# Patient Record
Sex: Female | Born: 1940 | Race: Black or African American | Hispanic: No | Smoking: Never smoker
Health system: Southern US, Community
[De-identification: ages and names within clinical notes are randomized; demographics above are authoritative.]

## PROBLEM LIST (undated history)

## (undated) DIAGNOSIS — N189 Chronic kidney disease, unspecified: Secondary | ICD-10-CM

## (undated) DIAGNOSIS — E079 Disorder of thyroid, unspecified: Secondary | ICD-10-CM

## (undated) DIAGNOSIS — K219 Gastro-esophageal reflux disease without esophagitis: Secondary | ICD-10-CM

## (undated) DIAGNOSIS — M1990 Unspecified osteoarthritis, unspecified site: Secondary | ICD-10-CM

## (undated) DIAGNOSIS — K5790 Diverticulosis of intestine, part unspecified, without perforation or abscess without bleeding: Secondary | ICD-10-CM

## (undated) DIAGNOSIS — G5600 Carpal tunnel syndrome, unspecified upper limb: Secondary | ICD-10-CM

## (undated) DIAGNOSIS — I10 Essential (primary) hypertension: Secondary | ICD-10-CM

## (undated) DIAGNOSIS — K589 Irritable bowel syndrome without diarrhea: Secondary | ICD-10-CM

## (undated) DIAGNOSIS — I219 Acute myocardial infarction, unspecified: Secondary | ICD-10-CM

## (undated) DIAGNOSIS — E785 Hyperlipidemia, unspecified: Secondary | ICD-10-CM

## (undated) DIAGNOSIS — C50919 Malignant neoplasm of unspecified site of unspecified female breast: Secondary | ICD-10-CM

## (undated) DIAGNOSIS — K449 Diaphragmatic hernia without obstruction or gangrene: Secondary | ICD-10-CM

## (undated) DIAGNOSIS — D649 Anemia, unspecified: Secondary | ICD-10-CM

## (undated) DIAGNOSIS — I7300 Raynaud's syndrome without gangrene: Secondary | ICD-10-CM

## (undated) HISTORY — PX: CHOLECYSTECTOMY: SHX55

## (undated) HISTORY — PX: APPENDECTOMY: SHX54

---

## 2014-11-10 DIAGNOSIS — C50919 Malignant neoplasm of unspecified site of unspecified female breast: Secondary | ICD-10-CM

## 2014-11-10 HISTORY — DX: Malignant neoplasm of unspecified site of unspecified female breast: C50.919

## 2014-12-08 HISTORY — PX: BREAST LUMPECTOMY WITH AXILLARY LYMPH NODE BIOPSY: SHX5593

## 2014-12-11 ENCOUNTER — Encounter: Payer: Self-pay | Admitting: Radiation Oncology

## 2014-12-11 ENCOUNTER — Telehealth: Payer: Self-pay | Admitting: *Deleted

## 2014-12-11 NOTE — Progress Notes (Signed)
Location of Breast Cancer: Left Breast  9 o'clock position, Initial DX 11/10/14   Histology per Pathology Report: July 25,216 : Left Breast localization lumpectomy,lymph node bx and axillary lymph dissection ,Invasive mammary carcinoma ,Ductal  differentiated ,G3, Receptor Status: ER( neg  ), PR (  neg ), Her2-neu ( neg  )  Did patient present with symptoms (if so, please note symptoms) or was this found on screening mammography?: patient discovered lump   Past/Anticipated interventions by surgeon, if any:Dr. Horowitz,Joel,MD 12/08/14,[place] follow up scheduled with Dr. Tona Sensing for December 18, 2014  Past/Anticipated interventions by medical oncology, if any: Chemotherapy : scheduled for new consult with Gudena on 12/15/2014 Lymphedema issues, if any: NO  Pain issues, if any:  Reports mild left breast pain only following activity such as house work  SAFETY ISSUES:  Prior radiation? NO  Pacemaker/ICD? NO  Possible current pregnancy?N/A  Is the patient on methotrexate? NO  Current Complaints / other details:  Widowed, Retired,  [id number], menses age 74, lmp=65, estrogen 2 years,non smoker, no alcohol or illicit drug use, no smokeless tobaco  Father hx stroke/cva,heart disease,DM,   Allergies: NKDA  Full ROM of both upper extremities noted. No lymphedema noted. Denies nipple discharge. Resides in [place]. Living with daughter until treatments are complete. Hopes to return home to [place] eventually.  BP 142/67 mmHg  Pulse 67  Temp(Src) 98 F (36.7 C) (Oral)  Resp 16  Ht 5' 6"  (1.676 m)  Wt 196 lb 4.8 oz (89.041 kg)  BMI 31.70 kg/m2  SpO2 100% Wt Readings from Last 3 Encounters:  12/15/14 196 lb 4.8 oz (89.041 kg)

## 2014-12-11 NOTE — Telephone Encounter (Signed)
Received referral for pt to see Dr. Lindi Adie.  Called and spoke w/ pt's daughter Annette Meadows) and confirmed 12/15/14 appt w/ pt.  Unable to mail before appt letter - gave verbal.  Unable to mail welcoming packet - gave directions and instructions.  Unable to mail intake form - placed a note for one to be given at time of check in.  Called pt's PCP and left a message for Annette Meadows - Dr. Francine Meadows assistant requesting the referral auth.  Placed records in Dr. Geralyn Flash box.  Did not need to send a copy to HIM - they are already scanned in.

## 2014-12-12 ENCOUNTER — Telehealth: Payer: Self-pay | Admitting: *Deleted

## 2014-12-12 NOTE — Telephone Encounter (Signed)
Received a call from [place] at pt's PCP and she stated that pt's secondary insurance [employer] does not require a referral auth.

## 2014-12-15 ENCOUNTER — Ambulatory Visit: Payer: Medicare Other

## 2014-12-15 ENCOUNTER — Ambulatory Visit
Admission: RE | Admit: 2014-12-15 | Discharge: 2014-12-15 | Disposition: A | Discharge status: Home / Self Care | Payer: Medicare Other | Source: Ambulatory Visit | Attending: Radiation Oncology | Admitting: Radiation Oncology

## 2014-12-15 ENCOUNTER — Encounter: Payer: Self-pay | Admitting: Hematology and Oncology

## 2014-12-15 ENCOUNTER — Encounter: Payer: Self-pay | Admitting: Radiation Oncology

## 2014-12-15 ENCOUNTER — Telehealth: Payer: Self-pay | Admitting: Hematology and Oncology

## 2014-12-15 ENCOUNTER — Ambulatory Visit (HOSPITAL_BASED_OUTPATIENT_CLINIC_OR_DEPARTMENT_OTHER): Payer: Medicare Other | Admitting: Hematology and Oncology

## 2014-12-15 VITALS — BP 142/67 | HR 67 | Temp 98.0°F | Resp 16 | Ht 66.0 in | Wt 196.3 lb

## 2014-12-15 VITALS — BP 151/69 | HR 69 | Temp 98.4°F | Resp 18 | Ht 66.0 in | Wt 197.6 lb

## 2014-12-15 DIAGNOSIS — E119 Type 2 diabetes mellitus without complications: Secondary | ICD-10-CM | POA: Diagnosis not present

## 2014-12-15 DIAGNOSIS — Z510 Encounter for antineoplastic radiation therapy: Secondary | ICD-10-CM | POA: Diagnosis not present

## 2014-12-15 DIAGNOSIS — Z171 Estrogen receptor negative status [ER-]: Secondary | ICD-10-CM | POA: Diagnosis not present

## 2014-12-15 DIAGNOSIS — C50212 Malignant neoplasm of upper-inner quadrant of left female breast: Secondary | ICD-10-CM

## 2014-12-15 DIAGNOSIS — C50412 Malignant neoplasm of upper-outer quadrant of left female breast: Secondary | ICD-10-CM | POA: Insufficient documentation

## 2014-12-15 HISTORY — DX: Anemia, unspecified: D64.9

## 2014-12-15 HISTORY — DX: Raynaud's syndrome without gangrene: I73.00

## 2014-12-15 HISTORY — DX: Irritable bowel syndrome, unspecified: K58.9

## 2014-12-15 HISTORY — DX: Malignant neoplasm of unspecified site of unspecified female breast: C50.919

## 2014-12-15 HISTORY — DX: Hyperlipidemia, unspecified: E78.5

## 2014-12-15 HISTORY — DX: Chronic kidney disease, unspecified: N18.9

## 2014-12-15 HISTORY — DX: Acute myocardial infarction, unspecified: I21.9

## 2014-12-15 HISTORY — DX: Essential (primary) hypertension: I10

## 2014-12-15 HISTORY — DX: Carpal tunnel syndrome, unspecified upper limb: G56.00

## 2014-12-15 HISTORY — DX: Gastro-esophageal reflux disease without esophagitis: K21.9

## 2014-12-15 HISTORY — DX: Unspecified osteoarthritis, unspecified site: M19.90

## 2014-12-15 HISTORY — DX: Diaphragmatic hernia without obstruction or gangrene: K44.9

## 2014-12-15 HISTORY — DX: Diverticulosis of intestine, part unspecified, without perforation or abscess without bleeding: K57.90

## 2014-12-15 HISTORY — DX: Disorder of thyroid, unspecified: E07.9

## 2014-12-15 MED ORDER — ONDANSETRON HCL 8 MG PO TABS: 8.0000 mg | ORAL_TABLET | Freq: Two times a day (BID) | ORAL | Status: DC

## 2014-12-15 MED ORDER — PROCHLORPERAZINE MALEATE 10 MG PO TABS: 10.0000 mg | ORAL_TABLET | Freq: Four times a day (QID) | ORAL | Status: DC | PRN

## 2014-12-15 MED ORDER — LORAZEPAM 0.5 MG PO TABS: 0.5000 mg | ORAL_TABLET | Freq: Every day | ORAL | Status: DC

## 2014-12-15 NOTE — Assessment & Plan Note (Signed)
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  Recommendation: 1. Adjuvant chemotherapy with Taxotere and Cytoxan every 3 weeks 4 cycles 2. Followed by adjuvant radiation therapy  I decided on Taxotere and Cytoxan because of her advanced age as well as her borderline performance status. I recommended putting a port but patient is reluctant to having any surgeries. She wants to try with the peripheral veins. I discussed with her extensively become difficult to give chemotherapy if her veins do not cooperate. If her veins are not able to receive chemotherapy then we will have to make a recommendation for port implantation.  Because she is diabetic, I cannot use dexamethasone for premedication prior to chemotherapy. Return to clinic next Tuesday to start chemotherapy and one week later to have an appointment with the nurse practitioner for follow-up.

## 2014-12-15 NOTE — Progress Notes (Signed)
Radiation Oncology         [phone] ________________________________  Name: Annette Meadows MRN: [id number]  Date: 12/15/2014  DOB: [birth year]  CC:Pcp Not In System  Theodoro Doing, MD     REFERRING PHYSICIAN: Arne Cleveland I, MD  DIAGNOSIS: The primary encounter diagnosis was Malignant neoplasm of upper-outer quadrant of left female breast. A diagnosis of Breast cancer of upper-inner quadrant of left female breast was also pertinent to this visit. pT2,pN51m,pM  HISTORY OF PRESENT ILLNESS::Annette PTottyis a 74y.o. female who is seen for an initial consultation visit regarding the patient's diagnosis of breast cancer.  The patient was found to have suspicious findings within the left breast on initial mammogram. The patient has had symptoms prior to this study: PCP felt lump. A diagnostic mammogram and breast ultrasound confirmed this finding. On ultrasound, the tumor measured 3 cm with microcalcifications and was present in the lower inner quadrant of the left breast. Likely benign findings in right breast which will be monitored.   A biopsy was performed. This revealed invasive mammary carcincoma.   The patient has not undergone an MRI scan of  the breasts.  The patient proceeded to undergo surgical resection of the breast cancer. This corresponded to a lumpectomy. Pathology revealed a grade III invasive mammary carcinoma that measured 10.1 mm. The margins were negative. In terms of lymph nodes, 1/2 nodes were positive for metastatic disease. Receptor studies indicated that the tumor is estrogen receptor negative, progesterone receptor negative, and HER-2/neu negative.  An Oncotype test was not ordered.    Full ROM of both upper extremities noted. No lymphedema noted. Denies nipple discharge. [place] [facility] [place] are [place]. [place]. [place]     PREVIOUS RADIATION THERAPY: No   PAST MEDICAL HISTORY:  has a past medical history of Breast cancer (11/10/14); GERD (gastroesophageal reflux disease); Chronic kidney disease; Thyroid disease; Hypertension; IBS (irritable bowel syndrome); Myocardial infarction; Diverticulosis; Hiatal hernia; Hyperlipidemia; Osteoarthritis; CTS (carpal tunnel syndrome); Raynaud phenomenon; Anemia; and Diaphragmatic hernia.     PAST SURGICAL HISTORY: Past Surgical History  Procedure Laterality Date  . Cholecystectomy    . Appendectomy    . Breast lumpectomy with axillary lymph node biopsy Left 12/08/14     FAMILY HISTORY: family history is negative for Cancer.   SOCIAL HISTORY:  reports that she has never smoked. She has never used smokeless tobacco. She reports that she does not drink alcohol or use illicit drugs.   ALLERGIES: Review of patient's allergies indicates no known allergies.   MEDICATIONS:  Current Outpatient Prescriptions  Medication Sig Dispense Refill  . amLODipine-benazepril (LOTREL) 5-40 MG per capsule Take 1 capsule by mouth daily.    .Marland Kitchenatorvastatin (LIPITOR) 40 MG tablet Take 40 mg by mouth daily.    . carvedilol (COREG) 25 MG tablet Take 25 mg by mouth 2 (two) times daily with a meal.    . Cetirizine HCl (ZYRTEC ALLERGY PO) Take 10 mg by mouth daily.    . Cholecalciferol (VITAMIN D) 2000 UNITS tablet Take 2,000 Units by mouth daily.    . diclofenac sodium (VOLTAREN) 1 % GEL Apply topically 2 (two) times daily.    .Marland Kitchenesomeprazole (NEXIUM) 40 MG capsule Take 40 mg by mouth daily at 12 noon.    . fluticasone (FLONASE) 50 MCG/ACT nasal spray Place 1 spray into both nostrils 2 (  two) times daily.    Marland Kitchen gabapentin (NEURONTIN) 300 MG capsule Take 300 mg by mouth 3 (three) times daily.    . Insulin Glargine (LANTUS SOLOSTAR) 100 UNIT/ML Solostar Pen Inject 40 Units into the skin daily at 10 pm.    . levothyroxine (SYNTHROID, LEVOTHROID) 50 MCG tablet Take 50  mcg by mouth daily before breakfast.    . montelukast (SINGULAIR) 10 MG tablet Take 10 mg by mouth at bedtime.    . SitaGLIPtin-MetFORMIN HCl (JANUMET XR) 50-1000 MG TB24 Take 1,000 mg by mouth 2 (two) times daily.    . traMADol (ULTRAM) 50 MG tablet Take by mouth every 6 (six) hours as needed.    Marland Kitchen aspirin 81 MG tablet Take 81 mg by mouth daily.    . cholestyramine light (PREVALITE) 4 G packet Take 4 g by mouth daily.    . Diclofenac Sodium (PENNSAID) 2 % SOLN Place 1 spray onto the skin 2 (two) times daily.    Marland Kitchen LORazepam (ATIVAN) 0.5 MG tablet Take 1 tablet (0.5 mg total) by mouth at bedtime. 30 tablet 0  . ondansetron (ZOFRAN) 8 MG tablet Take 1 tablet (8 mg total) by mouth 2 (two) times daily. Start the day after chemo for 3 days. Then take as needed for nausea or vomiting. 30 tablet 1  . oxybutynin (DITROPAN-XL) 10 MG 24 hr tablet   0  . OXYBUTYNIN CHLORIDE ER PO Take 10 mg by mouth daily.    . prochlorperazine (COMPAZINE) 10 MG tablet Take 1 tablet (10 mg total) by mouth every 6 (six) hours as needed (Nausea or vomiting). 30 tablet 1   No current facility-administered medications for this encounter.     REVIEW OF SYSTEMS:  A 15 point review of systems is documented in the electronic medical record. This was obtained by the nursing staff. However, I reviewed this with the patient to discuss relevant findings and make appropriate changes.  Pertinent items are noted in HPI.    PHYSICAL EXAM:  height is 5' 6"  (1.676 m) and weight is 196 lb 4.8 oz (89.041 kg). Her oral temperature is 98 F (36.7 C). Her blood pressure is 142/67 and her pulse is 67. Her respiration is 16 and oxygen saturation is 100%.    ECOG = 0  0 - Asymptomatic (Fully active, able to carry on all predisease activities without restriction)  1 - Symptomatic but completely ambulatory (Restricted in physically strenuous activity but ambulatory and able to carry out work of a light or sedentary nature. For example, light  housework, office work)  2 - Symptomatic, <50% in bed during the day (Ambulatory and capable of all self care but unable to carry out any work activities. Up and about more than 50% of waking hours)  3 - Symptomatic, >50% in bed, but not bedbound (Capable of only limited self-care, confined to bed or chair 50% or more of waking hours)  4 - Bedbound (Completely disabled. Cannot carry on any self-care. Totally confined to bed or chair)  5 - Death   Eustace Pen MM, Creech RH, Tormey DC, et al. [phone]). "Toxicity and response criteria of the [facility]". [place] Oncol. 5 (6): 649-55  General: Well-developed, in no acute distress HEENT: Normocephalic, atraumatic; oral cavity clear Neck: Supple without any lymphadenopathy Cardiovascular: Regular rate and rhythm Respiratory: Clear to auscultation bilaterally Breasts: Well-healed incision horizontally medial to nipple. Well-healed axillary incision. No other suspicious findings in the breast.  GI: Soft, nontender, normal bowel  sounds Extremities: No edema present Neuro: No focal deficits   LABORATORY DATA:  No results found for: WBC, HGB, HCT, MCV, PLT No results found for: NA, K, CL, CO2 No results found for: ALT, AST, GGT, ALKPHOS, BILITOT    RADIOGRAPHY: No results found.   Findings for [facility] Report Rexene Edison), June 9th 2016       IMPRESSION:    Breast cancer of upper-inner quadrant of left female breast   11/10/2014 Initial Diagnosis left breast biopsy: Invasive ductal carcinoma, grade 3 ER negative, PR negative HER-2/neu equivocal 2+ by IHC, FISH negative ratio 1.3   11/26/2014 Surgery left lumpectomy invasive lobular cancer, grade 3, multifocal, 2.5 cm and 0.25 cm, margins negative, ER 0%, PR 0%, HER-2 -1/11 micro-met 0.3 cm    The patient has a recent diagnosis of invasive mammary carcinoma of the left breast. She has completed surgery and appears to be doing  satisfactorily postoperatively.  The patient is a good candidate for postoperative radiation treatment at the appropriate time. On pathology, the patient underwent a lumpectomy with negative margins for her triple negative breast cancer. One micrometastasis was seen in a lymph node. I discussed with the patient the role of adjuvant radiation treatment in this setting. We discussed the potential benefit of radiation treatment, especially with regards to local control of the patient's tumor. We also discussed the possible side effects and risks of such a treatment as well.   All of the patient's questions were answered.   PLAN: I will follow up with the patient after she meets with medical oncology. We will then decide the appropriate time frame to begin adjuvant radiation treatment which I believe would be helpful for her case.  Patient will see Dr.Gudena later today.      ________________________________   Jodelle Gross, MD, PhD   **Disclaimer: This note was dictated with voice recognition software. Similar sounding words can inadvertently be transcribed and this note may contain transcription errors which may not have been corrected upon publication of note.**  This document serves as a record of services personally performed by Kyung Rudd, MD. It was created on his behalf by Derek Mound, a trained medical scribe. The creation of this record is based on the scribe's personal observations and the provider's statements to them. This document has been checked and approved by the attending provider.

## 2014-12-15 NOTE — Telephone Encounter (Signed)
Gave avs & calendar for August through October

## 2014-12-15 NOTE — Progress Notes (Signed)
New pt intake for received from patient.  Chart updated.  Reviewed by Dr. Lindi Adie.  Sent to scan.

## 2014-12-15 NOTE — Progress Notes (Signed)
[facility] CONSULT NOTE  Patient Care Team: Pcp Not In System as PCP - General  CHIEF COMPLAINTS/PURPOSE OF CONSULTATION:  Newly diagnosed breast cancer  HISTORY OF PRESENTING ILLNESS:  Annette Meadows 74 y.o. female is here because of recent diagnosis of left breast cancer. Patient lives in [place]. She had always felt a nodule in the left breast which was not bothering her. Recently she fell from her bed on her breast and started to have pain in that area. She then went on to see her primary care physician who palpated the breast and felt the nodule. This led to mammogram ultrasound and biopsy which confirmed that she had triple negative left breast cancer. She underwent left lumpectomy which revealed invasive lobular cancer grade 3 multifocal with the largest measuring to 1/2 cm in size. Triple negative disease. However 1/11 lymph nodes with micrometastatic disease measuring 0.3 cm. She moved to [facility] because she wanted to live close to her daughter. She is here accompanied by her daughter to discuss adjuvant treatment options. She has recovered very well from recent surgery and appears to have very minimal discomfort.  I reviewed her records extensively and collaborated the history with the patient.  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 2+ by IHC, FISH negative ratio 1.3   11/26/2014 Surgery left lumpectomy invasive lobular cancer, grade 3, multifocal, 2.5 cm and 0.25 cm, margins negative, ER 0%, PR 0%, HER-2 -1/11 micro-met 0.3 cm   MEDICAL HISTORY:  Past Medical History  Diagnosis Date  . Breast cancer 11/10/14    initial bx left breast 9 o'clock mass  . GERD (gastroesophageal reflux disease)   . Chronic kidney disease     chronic kidney stage 3  . Thyroid disease      hypo  . Hypertension   . IBS (irritable bowel syndrome)   . Myocardial infarction   . Diverticulosis   . Hiatal hernia   . Hyperlipidemia   . Osteoarthritis   . CTS (carpal tunnel syndrome)     B/L  . Raynaud phenomenon   . Anemia     chronic  . Diaphragmatic hernia     SURGICAL HISTORY: Past Surgical History  Procedure Laterality Date  . Cholecystectomy    . Appendectomy    . Breast lumpectomy with axillary lymph node biopsy Left 12/08/14    SOCIAL HISTORY: History   Social History  . Marital Status: Widowed    Spouse Name: N/A  . Number of Children: N/A  . Years of Education: N/A   Occupational History  . Not on file.   Social History Main Topics  . Smoking status: Never Smoker   . Smokeless tobacco: Never Used  . Alcohol Use: No  . Drug Use: No  . Sexual Activity: No   Other Topics Concern  . Not on file   Social History Narrative    FAMILY HISTORY: Family History  Problem Relation Age of Onset  . Cancer Neg Hx     ALLERGIES:  has No Known Allergies.  MEDICATIONS:  Current Outpatient Prescriptions  Medication Sig Dispense Refill  . amLODipine-benazepril (LOTREL) 5-40 MG per capsule Take 1 capsule by mouth daily.    Marland Kitchen aspirin 81 MG tablet Take 81 mg by mouth daily.    Marland Kitchen atorvastatin (LIPITOR) 40 MG tablet Take 40 mg  by mouth daily.    . carvedilol (COREG) 25 MG tablet Take 25 mg by mouth 2 (two) times daily with a meal.    . Cetirizine HCl (ZYRTEC ALLERGY PO) Take 10 mg by mouth daily.    . Cholecalciferol (VITAMIN D) 2000 UNITS tablet Take 2,000 Units by mouth daily.    . cholestyramine light (PREVALITE) 4 G packet Take 4 g by mouth daily.    . Diclofenac Sodium (PENNSAID) 2 % SOLN Place 1 spray onto the skin 2 (two) times daily.    . diclofenac sodium (VOLTAREN) 1 % GEL Apply topically 2 (two) times daily.    Marland Kitchen esomeprazole (NEXIUM) 40 MG capsule Take 40 mg by mouth daily at 12 noon.    . fluticasone (FLONASE) 50 MCG/ACT nasal spray Place 1  spray into both nostrils 2 (two) times daily.    Marland Kitchen gabapentin (NEURONTIN) 300 MG capsule Take 300 mg by mouth 3 (three) times daily.    . Insulin Glargine (LANTUS SOLOSTAR) 100 UNIT/ML Solostar Pen Inject 40 Units into the skin daily at 10 pm.    . levothyroxine (SYNTHROID, LEVOTHROID) 50 MCG tablet Take 50 mcg by mouth daily before breakfast.    . montelukast (SINGULAIR) 10 MG tablet Take 10 mg by mouth at bedtime.    Marland Kitchen oxybutynin (DITROPAN-XL) 10 MG 24 hr tablet   0  . OXYBUTYNIN CHLORIDE ER PO Take 10 mg by mouth daily.    . SitaGLIPtin-MetFORMIN HCl (JANUMET XR) 50-1000 MG TB24 Take 1,000 mg by mouth 2 (two) times daily.    . traMADol (ULTRAM) 50 MG tablet Take by mouth every 6 (six) hours as needed.    Marland Kitchen LORazepam (ATIVAN) 0.5 MG tablet Take 1 tablet (0.5 mg total) by mouth at bedtime. 30 tablet 0  . ondansetron (ZOFRAN) 8 MG tablet Take 1 tablet (8 mg total) by mouth 2 (two) times daily. Start the day after chemo for 3 days. Then take as needed for nausea or vomiting. 30 tablet 1  . prochlorperazine (COMPAZINE) 10 MG tablet Take 1 tablet (10 mg total) by mouth every 6 (six) hours as needed (Nausea or vomiting). 30 tablet 1   No current facility-administered medications for this visit.    REVIEW OF SYSTEMS:   Constitutional: Denies fevers, chills or abnormal night sweats Eyes: Denies blurriness of vision, double vision or watery eyes Ears, nose, mouth, throat, and face: Denies mucositis or sore throat Respiratory: Denies cough, dyspnea or wheezes Cardiovascular: Denies palpitation, chest discomfort or lower extremity swelling Gastrointestinal:  Denies nausea, heartburn or change in bowel habits Skin: Denies abnormal skin rashes Lymphatics: Denies new lymphadenopathy or easy bruising Neurological:Denies numbness, tingling or new weaknesses Behavioral/Psych: Mood is stable, no new changes  Breast: recovering very well from recent surgery All other systems were reviewed with the patient  and are negative.  PHYSICAL EXAMINATION: ECOG PERFORMANCE STATUS: 1 - Symptomatic but completely ambulatory  Filed Vitals:   12/15/14 1510  BP: 151/69  Pulse: 69  Temp: 98.4 F (36.9 C)  Resp: 18   Filed Weights   12/15/14 1510  Weight: 197 lb 9.6 oz (89.631 kg)    GENERAL:alert, no distress and comfortable SKIN: skin color, texture, turgor are normal, no rashes or significant lesions EYES: normal, conjunctiva are pink and non-injected, sclera clear OROPHARYNX:no exudate, no erythema and lips, buccal mucosa, and tongue normal  NECK: supple, thyroid normal size, non-tender, without nodularity LYMPH:  no palpable lymphadenopathy in the cervical, axillary or inguinal LUNGS: clear to  auscultation and percussion with normal breathing effort HEART: regular rate & rhythm and no murmurs and no lower extremity edema ABDOMEN:abdomen soft, non-tender and normal bowel sounds Musculoskeletal:no cyanosis of digits and no clubbing  PSYCH: alert & oriented x 3 with fluent speech NEURO: no focal motor/sensory deficits BREAST:breasts and axillary scars are well-healed (exam performed in the presence of a chaperone)   RADIOGRAPHIC STUDIES: I have personally reviewed the radiological reports and agreed with the findings in the report.  ASSESSMENT AND PLAN:  Breast cancer of upper-inner quadrant of left female breast Left breast invasive lobular cancer 2.5 cm in size status post lumpectomy showing invasive lobular cancer, grade 3, multifocal, 2.5 cm and 0.25 cm, margins negative, ER 0%, PR 0%, HER-2 -1/11 micro-met (0.3 cm)  Pathology review: I discussed with the patient the details pathology report including the significance of triple negative disease and how she is not eligible to receive any antiestrogen therapies.  Recommendation: 1. Adjuvant chemotherapy with Taxotere and Cytoxan every 3 weeks 4 cycles 2. Followed by adjuvant radiation therapy  I decided on Taxotere and Cytoxan because  of her advanced age as well as her borderline performance status. I recommended putting a port but patient is reluctant to having any surgeries. She wants to try with the peripheral veins. I discussed with her extensively become difficult to give chemotherapy if her veins do not cooperate. If her veins are not able to receive chemotherapy then we will have to make a recommendation for port implantation.  Chemotherapy counseling:I have discussed the risks and benefits of chemotherapy including the risks of nausea/ vomiting, risk of infection from low WBC count, fatigue due to chemo or anemia, bruising or bleeding due to low platelets, mouth sores, loss/ change in taste and decreased appetite. Liver and kidney function will be monitored through out chemotherapy as abnormalities in liver and kidney function may be a side effect of treatment. Risk of permanent bone marrow dysfunction and leukemia due to chemo were also discussed.  Diabetes mellitus:Because she is diabetic, I cannot use dexamethasone for premedication prior to chemotherapy. Return to clinic next Tuesday to start chemotherapy and one week later to have an appointment with the nurse practitioner for follow-up. patient really enjoys watching college football and has season tickets and would not like to [place]. Much of the season. She understands that she may have to miss a few games.  All questions were answered. The patient knows to call the clinic with any problems, questions or concerns.    Rulon Eisenmenger, MD 5:18 PM

## 2014-12-15 NOTE — Progress Notes (Signed)
See progress note under physician encounter. 

## 2014-12-16 ENCOUNTER — Encounter: Payer: Self-pay | Admitting: *Deleted

## 2014-12-16 ENCOUNTER — Other Ambulatory Visit: Payer: Medicare Other

## 2014-12-16 ENCOUNTER — Other Ambulatory Visit: Payer: Self-pay

## 2014-12-16 ENCOUNTER — Encounter: Payer: Self-pay | Admitting: Radiation Oncology

## 2014-12-16 ENCOUNTER — Telehealth: Payer: Self-pay | Admitting: Hematology and Oncology

## 2014-12-16 NOTE — Telephone Encounter (Signed)
Added lab for [id number]...the patient will get new sched at 8.9 visit

## 2014-12-23 ENCOUNTER — Ambulatory Visit (HOSPITAL_BASED_OUTPATIENT_CLINIC_OR_DEPARTMENT_OTHER): Payer: Medicare Other | Admitting: Hematology and Oncology

## 2014-12-23 ENCOUNTER — Ambulatory Visit (HOSPITAL_BASED_OUTPATIENT_CLINIC_OR_DEPARTMENT_OTHER): Payer: Medicare Other

## 2014-12-23 ENCOUNTER — Other Ambulatory Visit (HOSPITAL_BASED_OUTPATIENT_CLINIC_OR_DEPARTMENT_OTHER): Payer: Medicare Other

## 2014-12-23 ENCOUNTER — Encounter: Payer: Self-pay | Admitting: Hematology and Oncology

## 2014-12-23 VITALS — BP 164/71 | HR 64 | Temp 98.7°F | Resp 18

## 2014-12-23 VITALS — BP 160/76 | HR 67 | Temp 98.3°F | Resp 18 | Ht 66.0 in | Wt 196.4 lb

## 2014-12-23 DIAGNOSIS — D649 Anemia, unspecified: Secondary | ICD-10-CM

## 2014-12-23 DIAGNOSIS — E119 Type 2 diabetes mellitus without complications: Secondary | ICD-10-CM | POA: Diagnosis not present

## 2014-12-23 DIAGNOSIS — C50212 Malignant neoplasm of upper-inner quadrant of left female breast: Secondary | ICD-10-CM

## 2014-12-23 DIAGNOSIS — Z5111 Encounter for antineoplastic chemotherapy: Secondary | ICD-10-CM | POA: Diagnosis present

## 2014-12-23 LAB — COMPREHENSIVE METABOLIC PANEL (CC13)
ALBUMIN: 3.9 g/dL (ref 3.5–5.0)
ALK PHOS: 56 U/L (ref 40–150)
ALT: 14 U/L (ref 0–55)
ANION GAP: 8 meq/L (ref 3–11)
AST: 17 U/L (ref 5–34)
BUN: 14.3 mg/dL (ref 7.0–26.0)
CO2: 24 mEq/L (ref 22–29)
CREATININE: 1 mg/dL (ref 0.6–1.1)
Calcium: 9.8 mg/dL (ref 8.4–10.4)
Chloride: 108 mEq/L (ref 98–109)
EGFR: 61 mL/min/{1.73_m2} — AB (ref >90)
Glucose: 146 mg/dl — ABNORMAL HIGH (ref 70–140)
POTASSIUM: 4.9 meq/L (ref 3.5–5.1)
SODIUM: 140 meq/L (ref 136–145)
Total Bilirubin: 0.28 mg/dL (ref 0.20–1.20)
Total Protein: 7.5 g/dL (ref 6.4–8.3)

## 2014-12-23 LAB — CBC WITH DIFFERENTIAL/PLATELET
BASO%: 0.6 % (ref 0.0–2.0)
BASOS ABS: 0 10*3/uL (ref 0.0–0.1)
EOS ABS: 0.1 10*3/uL (ref 0.0–0.5)
EOS%: 0.9 % (ref 0.0–7.0)
HCT: 33.5 % — ABNORMAL LOW (ref 34.8–46.6)
HGB: 10.5 g/dL — ABNORMAL LOW (ref 11.6–15.9)
LYMPH%: 21.2 % (ref 14.0–49.7)
MCH: 25.8 pg (ref 25.1–34.0)
MCHC: 31.4 g/dL — AB (ref 31.5–36.0)
MCV: 82.1 fL (ref 79.5–101.0)
MONO#: 0.5 10*3/uL (ref 0.1–0.9)
MONO%: 7.7 % (ref 0.0–14.0)
NEUT#: 4.5 10*3/uL (ref 1.5–6.5)
NEUT%: 69.6 % (ref 38.4–76.8)
Platelets: 175 10*3/uL (ref 145–400)
RBC: 4.08 10*6/uL (ref 3.70–5.45)
RDW: 14.8 % — AB (ref 11.2–14.5)
WBC: 6.5 10*3/uL (ref 3.9–10.3)
lymph#: 1.4 10*3/uL (ref 0.9–3.3)

## 2014-12-23 MED ORDER — SODIUM CHLORIDE 0.9 % IV SOLN
Freq: Once | INTRAVENOUS | Status: AC
  Administered 2014-12-23: 14:00:00 via INTRAVENOUS
  Filled 2014-12-23: qty 5

## 2014-12-23 MED ORDER — PEGFILGRASTIM 6 MG/0.6ML ~~LOC~~ PSKT
6.0000 mg | PREFILLED_SYRINGE | Freq: Once | SUBCUTANEOUS | Status: AC
  Administered 2014-12-23: 6 mg via SUBCUTANEOUS
  Filled 2014-12-23: qty 0.6

## 2014-12-23 MED ORDER — SODIUM CHLORIDE 0.9 % IV SOLN
495.0000 mg/m2 | Freq: Once | INTRAVENOUS | Status: AC
  Administered 2014-12-23: 1000 mg via INTRAVENOUS
  Filled 2014-12-23: qty 50

## 2014-12-23 MED ORDER — PROCHLORPERAZINE MALEATE 10 MG PO TABS: 10.0000 mg | ORAL_TABLET | Freq: Four times a day (QID) | ORAL | Status: DC | PRN

## 2014-12-23 MED ORDER — DOCETAXEL CHEMO INJECTION 160 MG/16ML
50.0000 mg/m2 | Freq: Once | INTRAVENOUS | Status: AC
  Administered 2014-12-23: 100 mg via INTRAVENOUS
  Filled 2014-12-23: qty 10

## 2014-12-23 MED ORDER — PALONOSETRON HCL INJECTION 0.25 MG/5ML
0.2500 mg | Freq: Once | INTRAVENOUS | Status: AC
  Administered 2014-12-23: 0.25 mg via INTRAVENOUS

## 2014-12-23 MED ORDER — SODIUM CHLORIDE 0.9 % IV SOLN
Freq: Once | INTRAVENOUS | Status: AC
  Administered 2014-12-23: 13:00:00 via INTRAVENOUS

## 2014-12-23 MED ORDER — PALONOSETRON HCL INJECTION 0.25 MG/5ML
INTRAVENOUS | Status: AC
  Filled 2014-12-23: qty 5

## 2014-12-23 NOTE — Progress Notes (Signed)
Patient Care Team: Pcp Not In System as PCP - General  DIAGNOSIS: No matching staging information was found for the patient.  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 Based on her age, we elected to use Taxotere and Cytoxan. I will also decided to give a lower dosage of chemotherapy to assess tolerability to treatment. She is very anxious today. Denies any problems or concerns.  REVIEW OF SYSTEMS:   Constitutional: Denies fevers, chills or abnormal weight loss Eyes: Denies blurriness of vision Ears, nose, mouth, throat, and face: Denies mucositis or sore throat Respiratory: Denies cough, dyspnea or wheezes Cardiovascular: Denies palpitation, chest discomfort or lower extremity swelling Gastrointestinal:  Denies nausea, heartburn or change in bowel habits Skin: Denies abnormal skin rashes Lymphatics: Denies new lymphadenopathy or easy bruising Neurological:Denies numbness, tingling or new weaknesses Behavioral/Psych: Mood is stable, no new changes  Breast: Healed very well from recent surgery. All other systems were reviewed with the patient and are negative.  I have reviewed the past medical history, past surgical history, social history and  family history with the patient and they are unchanged from previous note.  ALLERGIES:  has No Known Allergies.  MEDICATIONS:  Current Outpatient Prescriptions  Medication Sig Dispense Refill  . amLODipine-benazepril (LOTREL) 5-40 MG per capsule Take 1 capsule by mouth daily.    Marland Kitchen aspirin 81 MG tablet Take 81 mg by mouth daily.    Marland Kitchen atorvastatin (LIPITOR) 40 MG tablet Take 40 mg by mouth daily.    . carvedilol (COREG) 25 MG tablet Take 25 mg by mouth 2 (two) times daily with a meal.    . Cetirizine HCl (ZYRTEC ALLERGY PO) Take 10 mg by mouth daily.    . Cholecalciferol (VITAMIN D) 2000 UNITS tablet Take 2,000 Units by mouth daily.    . cholestyramine light (PREVALITE) 4 G packet Take 4 g by mouth daily.    . Diclofenac Sodium (PENNSAID) 2 % SOLN Place 1 spray onto the skin 2 (two) times daily.    . diclofenac sodium (VOLTAREN) 1 % GEL Apply topically 2 (two) times daily.    Marland Kitchen esomeprazole (NEXIUM) 40 MG capsule Take 40 mg by mouth daily at 12 noon.    . fluticasone (FLONASE) 50 MCG/ACT nasal spray Place 1 spray into both nostrils 2 (two) times daily.    Marland Kitchen gabapentin (NEURONTIN) 300 MG capsule Take 300 mg by mouth 3 (three) times daily.    . Insulin Glargine (LANTUS SOLOSTAR) 100 UNIT/ML Solostar Pen Inject 40 Units into the skin daily at 10 pm.    . levothyroxine (SYNTHROID, LEVOTHROID) 50 MCG tablet Take 50 mcg by mouth daily before breakfast.    . LORazepam (ATIVAN) 0.5 MG tablet Take 1 tablet (0.5 mg total) by mouth at bedtime. 30 tablet 0  .  montelukast (SINGULAIR) 10 MG tablet Take 10 mg by mouth at bedtime.    . ondansetron (ZOFRAN) 8 MG tablet Take 1 tablet (8 mg total) by mouth 2 (two) times daily. Start the day after chemo for 3 days. Then take as needed for nausea or vomiting. 30 tablet 1  . oxybutynin (DITROPAN-XL) 10 MG 24 hr tablet   0  . OXYBUTYNIN CHLORIDE ER PO Take 10 mg by mouth daily.    . prochlorperazine (COMPAZINE) 10 MG tablet Take 1 tablet (10 mg total) by mouth  every 6 (six) hours as needed (Nausea or vomiting). 30 tablet 1  . SitaGLIPtin-MetFORMIN HCl (JANUMET XR) 50-1000 MG TB24 Take 1,000 mg by mouth 2 (two) times daily.    . traMADol (ULTRAM) 50 MG tablet Take by mouth every 6 (six) hours as needed.     No current facility-administered medications for this visit.    PHYSICAL EXAMINATION: ECOG PERFORMANCE STATUS: 1 - Symptomatic but completely ambulatory  Filed Vitals:   12/23/14 1153  BP: 160/76  Pulse: 67  Temp: 98.3 F (36.8 C)  Resp: 18   Filed Weights   12/23/14 1153  Weight: 196 lb 6.4 oz (89.086 kg)    GENERAL:alert, no distress and comfortable SKIN: skin color, texture, turgor are normal, no rashes or significant lesions EYES: normal, Conjunctiva are pink and non-injected, sclera clear OROPHARYNX:no exudate, no erythema and lips, buccal mucosa, and tongue normal  NECK: supple, thyroid normal size, non-tender, without nodularity LYMPH:  no palpable lymphadenopathy in the cervical, axillary or inguinal LUNGS: clear to auscultation and percussion with normal breathing effort HEART: regular rate & rhythm and no murmurs and no lower extremity edema ABDOMEN:abdomen soft, non-tender and normal bowel sounds Musculoskeletal:no cyanosis of digits and no clubbing  NEURO: alert & oriented x 3 with fluent speech, no focal motor/sensory deficits  LABORATORY DATA:  I have reviewed the data as listed   Chemistry   No results found for: NA, K, CL, CO2, BUN, CREATININE, GLU No results found for: CALCIUM, ALKPHOS, AST, ALT, BILITOT     Lab Results  Component Value Date   WBC 6.5 12/23/2014   HGB 10.5* 12/23/2014   HCT 33.5* 12/23/2014   MCV 82.1 12/23/2014   PLT 175 12/23/2014   NEUTROABS 4.5 12/23/2014   ASSESSMENT & PLAN:  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 Adjuvant chemotherapy with Taxotere and Cytoxan every 3 weeks 4 cycles 2. Followed by adjuvant radiation therapy Anemia: Normocytic anemia present even prior to starting chemotherapy with a hemoglobin of 10.5.  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  Return to clinic in 1 week for toxicity check with our nurse practitioner.  No orders of the defined types were placed in this encounter.   The patient has a good understanding of the overall plan. she agrees with it. she will call with any problems that may develop before the next visit here.   Rulon Eisenmenger, MD

## 2014-12-23 NOTE — Patient Instructions (Signed)
[facility] Discharge Instructions for Patients Receiving Chemotherapy  Today you received the following chemotherapy agents taxcotere  To help prevent nausea and vomiting after your treatment, we encourage you to take your nausea medication as prescribed.   If you develop nausea and vomiting that is not controlled by your nausea medication, call the clinic.   BELOW ARE SYMPTOMS THAT SHOULD BE REPORTED IMMEDIATELY:  *FEVER GREATER THAN 100.5 F  *CHILLS WITH OR WITHOUT FEVER  NAUSEA AND VOMITING THAT IS NOT CONTROLLED WITH YOUR NAUSEA MEDICATION  *UNUSUAL SHORTNESS OF BREATH  *UNUSUAL BRUISING OR BLEEDING  TENDERNESS IN MOUTH AND THROAT WITH OR WITHOUT PRESENCE OF ULCERS  *URINARY PROBLEMS  *BOWEL PROBLEMS  UNUSUAL RASH Items with * indicate a potential emergency and should be followed up as soon as possible.  Feel free to call the clinic you have any questions or concerns. The clinic phone number is [phone].  Please show the [facility] at check-in to the Emergency Department and triage nurse.

## 2014-12-23 NOTE — Assessment & Plan Note (Signed)
Left breast invasive lobular cancer 2.5 cm in size status post lumpectomy showing invasive lobular cancer, grade 3, multifocal, 2.5 cm and 0.25 cm, margins negative, ER 0%, PR 0%, HER-2 -1/11 micro-met (0.3 cm) Treatment plan: 1. Adjuvant chemotherapy with Taxotere and Cytoxan every 3 weeks 4 cycles 2. 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  Return to clinic in 1 week for toxicity check with our nurse practitioner.

## 2014-12-25 ENCOUNTER — Telehealth: Payer: Self-pay

## 2014-12-25 NOTE — Telephone Encounter (Signed)
Pt report temp was 96.7 last night and 98.8 at 11 am.  Let pt know her temp was in normal range.  Let pt know she should contact us immediately if her temperature reaches 100.5.  Pt voiced understanding.

## 2014-12-25 NOTE — Telephone Encounter (Signed)
[facility] call report rcvd - sent to scan.  Pt called in 12/24/14 8:52 pm reporting temps.  Attempted to call pt LMOVM - pt to return call to clinic.

## 2014-12-26 ENCOUNTER — Telehealth: Payer: Self-pay | Admitting: *Deleted

## 2014-12-26 NOTE — Telephone Encounter (Signed)
Received VM message from pt @ 8:18 am . Returned call and spoke with her. She states she had her first chemo on 12/23/14. This morning she states she has developed diarrhea-6 times this am, and doesn't always make it to the bathroom. Denies fever, nausea or vomiting.  She states she is able to drink fluids well and has been drinking fluids-water, gatorade-all morning. Has not tried any imodium. Advised her to have daughter or other family member pick up some Imodium @ drugstore. Instructed pt on how to take this. She voiced understanding. Informed pt that I would call her back early this afternoon to see how she is doing. She voiced understanding.

## 2014-12-26 NOTE — Telephone Encounter (Signed)
TC to patient to see how she was feeling this afternoon. She states she is feeling a little bit better-the diarrhea has stopped at this time. She took approx. 6 imodium over the course of the day.  No nausea or vomiting. She is able to drink fluids without any problems and eating somefood like chicken soup. Encouraged her to continue to do as she has been been doing and that if anything changes to call. She voiced understanding. No other issues identified.

## 2014-12-30 ENCOUNTER — Ambulatory Visit (HOSPITAL_COMMUNITY)
Admission: RE | Admit: 2014-12-30 | Discharge: 2014-12-30 | Disposition: A | Discharge status: Home / Self Care | Payer: Medicare Other | Source: Ambulatory Visit | Attending: Nurse Practitioner | Admitting: Nurse Practitioner

## 2014-12-30 ENCOUNTER — Telehealth: Payer: Self-pay | Admitting: Nurse Practitioner

## 2014-12-30 ENCOUNTER — Ambulatory Visit (HOSPITAL_BASED_OUTPATIENT_CLINIC_OR_DEPARTMENT_OTHER): Payer: Medicare Other | Admitting: Nurse Practitioner

## 2014-12-30 ENCOUNTER — Ambulatory Visit: Payer: Medicare Other

## 2014-12-30 ENCOUNTER — Other Ambulatory Visit: Payer: Self-pay | Admitting: Nurse Practitioner

## 2014-12-30 ENCOUNTER — Ambulatory Visit: Payer: Medicare Other | Admitting: Radiation Oncology

## 2014-12-30 ENCOUNTER — Encounter: Payer: Self-pay | Admitting: Nurse Practitioner

## 2014-12-30 ENCOUNTER — Other Ambulatory Visit (HOSPITAL_BASED_OUTPATIENT_CLINIC_OR_DEPARTMENT_OTHER): Payer: Medicare Other

## 2014-12-30 VITALS — BP 137/64 | HR 80 | Temp 98.6°F | Resp 18 | Ht 66.0 in | Wt 195.3 lb

## 2014-12-30 DIAGNOSIS — E119 Type 2 diabetes mellitus without complications: Secondary | ICD-10-CM | POA: Diagnosis not present

## 2014-12-30 DIAGNOSIS — M546 Pain in thoracic spine: Secondary | ICD-10-CM | POA: Insufficient documentation

## 2014-12-30 DIAGNOSIS — C50212 Malignant neoplasm of upper-inner quadrant of left female breast: Secondary | ICD-10-CM

## 2014-12-30 DIAGNOSIS — D649 Anemia, unspecified: Secondary | ICD-10-CM

## 2014-12-30 DIAGNOSIS — M545 Low back pain: Secondary | ICD-10-CM | POA: Diagnosis not present

## 2014-12-30 DIAGNOSIS — R197 Diarrhea, unspecified: Secondary | ICD-10-CM

## 2014-12-30 DIAGNOSIS — Z853 Personal history of malignant neoplasm of breast: Secondary | ICD-10-CM | POA: Diagnosis not present

## 2014-12-30 DIAGNOSIS — M549 Dorsalgia, unspecified: Secondary | ICD-10-CM | POA: Insufficient documentation

## 2014-12-30 DIAGNOSIS — K521 Toxic gastroenteritis and colitis: Secondary | ICD-10-CM | POA: Insufficient documentation

## 2014-12-30 DIAGNOSIS — T451X5A Adverse effect of antineoplastic and immunosuppressive drugs, initial encounter: Secondary | ICD-10-CM

## 2014-12-30 LAB — CBC WITH DIFFERENTIAL/PLATELET
BASO%: 0.3 % (ref 0.0–2.0)
BASOS ABS: 0 10*3/uL (ref 0.0–0.1)
EOS%: 0.3 % (ref 0.0–7.0)
Eosinophils Absolute: 0 10*3/uL (ref 0.0–0.5)
HEMATOCRIT: 31.2 % — AB (ref 34.8–46.6)
HGB: 9.8 g/dL — ABNORMAL LOW (ref 11.6–15.9)
LYMPH%: 22.5 % (ref 14.0–49.7)
MCH: 25.6 pg (ref 25.1–34.0)
MCHC: 31.3 g/dL — AB (ref 31.5–36.0)
MCV: 81.8 fL (ref 79.5–101.0)
MONO#: 0.9 10*3/uL (ref 0.1–0.9)
MONO%: 22.8 % — ABNORMAL HIGH (ref 0.0–14.0)
NEUT#: 2 10*3/uL (ref 1.5–6.5)
NEUT%: 54.1 % (ref 38.4–76.8)
PLATELETS: 155 10*3/uL (ref 145–400)
RBC: 3.82 10*6/uL (ref 3.70–5.45)
RDW: 14.4 % (ref 11.2–14.5)
WBC: 3.8 10*3/uL — ABNORMAL LOW (ref 3.9–10.3)
lymph#: 0.8 10*3/uL — ABNORMAL LOW (ref 0.9–3.3)

## 2014-12-30 LAB — COMPREHENSIVE METABOLIC PANEL (CC13)
ALT: 15 U/L (ref 0–55)
ANION GAP: 9 meq/L (ref 3–11)
AST: 18 U/L (ref 5–34)
Albumin: 3.4 g/dL — ABNORMAL LOW (ref 3.5–5.0)
Alkaline Phosphatase: 73 U/L (ref 40–150)
BUN: 11.1 mg/dL (ref 7.0–26.0)
CALCIUM: 9.4 mg/dL (ref 8.4–10.4)
CO2: 25 meq/L (ref 22–29)
CREATININE: 1.1 mg/dL (ref 0.6–1.1)
Chloride: 104 mEq/L (ref 98–109)
EGFR: 60 mL/min/{1.73_m2} — AB (ref >90)
Glucose: 103 mg/dl (ref 70–140)
POTASSIUM: 4.8 meq/L (ref 3.5–5.1)
Sodium: 138 mEq/L (ref 136–145)
Total Bilirubin: 0.26 mg/dL (ref 0.20–1.20)
Total Protein: 7.4 g/dL (ref 6.4–8.3)

## 2014-12-30 NOTE — Progress Notes (Signed)
Patient Care Team: Pcp Not In System as PCP - General  DIAGNOSIS: No matching staging information was found for the patient.  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 She tolerated this remarkably well. Her only complaints were diarrhea and aggravated back pain. She is using imodium and she is down to just 2-3 stools daily now. She has tramadol prescribed that she has been using for her back pain. Her daughter is concerned that there is more to her pain, and is concerned about cancerous lesions.  REVIEW OF SYSTEMS:   Constitutional: Denies fevers, chills or abnormal weight loss Eyes: Denies blurriness of vision Ears, nose, mouth, throat, and face: Denies mucositis or sore throat Respiratory: Denies cough, dyspnea or wheezes Cardiovascular: Denies palpitation, chest discomfort or lower extremity swelling Gastrointestinal:  Denies nausea, heartburn. Diarrhea. Skin: Denies abnormal skin rashes Lymphatics: Denies new lymphadenopathy or easy bruising Neurological:Denies numbness, tingling or new weaknesses Behavioral/Psych: Mood is stable, no new changes  Breast: Healed very well from recent surgery. All other systems were reviewed with the patient and are  negative.  I have reviewed the past medical history, past surgical history, social history and family history with the patient and they are unchanged from previous note.  ALLERGIES:  has No Known Allergies.  MEDICATIONS:  Current Outpatient Prescriptions  Medication Sig Dispense Refill  . amLODipine-benazepril (LOTREL) 5-40 MG per capsule Take 1 capsule by mouth daily.    Marland Kitchen aspirin 81 MG tablet Take 81 mg by mouth daily.    Marland Kitchen atorvastatin (LIPITOR) 40 MG tablet Take 40 mg by mouth daily.    . carvedilol (COREG) 25 MG tablet Take 25 mg by mouth 2 (two) times daily with a meal.    . Cetirizine HCl (ZYRTEC ALLERGY PO) Take 10 mg by mouth daily.    . Cholecalciferol (VITAMIN D) 2000 UNITS tablet Take 2,000 Units by mouth daily.    . cholestyramine light (PREVALITE) 4 G packet Take 4 g by mouth daily.    . Diclofenac Sodium (PENNSAID) 2 % SOLN Place 1 spray onto the skin 2 (two) times daily.    . diclofenac sodium (VOLTAREN) 1 % GEL Apply topically 2 (two) times daily.    Marland Kitchen esomeprazole (NEXIUM) 40 MG capsule Take 40 mg by mouth daily at 12 noon.    . fluticasone (FLONASE) 50 MCG/ACT nasal spray Place 1 spray into both nostrils 2 (two) times daily.    Marland Kitchen gabapentin (NEURONTIN) 300 MG capsule Take 300 mg by mouth 3 (three) times daily.    . Insulin Glargine (LANTUS SOLOSTAR) 100 UNIT/ML Solostar Pen Inject 40 Units into the skin daily at 10 pm.    . levothyroxine (SYNTHROID, LEVOTHROID) 50 MCG tablet Take 50 mcg by mouth daily before breakfast.    . LORazepam (ATIVAN) 0.5 MG  tablet Take 1 tablet (0.5 mg total) by mouth at bedtime. 30 tablet 0  . montelukast (SINGULAIR) 10 MG tablet Take 10 mg by mouth at bedtime.    . ondansetron (ZOFRAN) 8 MG tablet Take 1 tablet (8 mg total) by mouth 2 (two) times daily. Start the day after chemo for 3 days. Then take as needed for nausea or vomiting. 30 tablet 1  . oxybutynin (DITROPAN-XL) 10 MG 24 hr tablet   0  . OXYBUTYNIN CHLORIDE ER PO Take 10 mg by mouth  daily.    . prochlorperazine (COMPAZINE) 10 MG tablet Take 1 tablet (10 mg total) by mouth every 6 (six) hours as needed (Nausea or vomiting). 30 tablet 1  . SitaGLIPtin-MetFORMIN HCl (JANUMET XR) 50-1000 MG TB24 Take 1,000 mg by mouth 2 (two) times daily.    . traMADol (ULTRAM) 50 MG tablet Take by mouth every 6 (six) hours as needed.     No current facility-administered medications for this visit.    PHYSICAL EXAMINATION: ECOG PERFORMANCE STATUS: 1 - Symptomatic but completely ambulatory  Filed Vitals:   12/30/14 1337  BP: 137/64  Pulse: 80  Temp: 98.6 F (37 C)  Resp: 18   Filed Weights   12/30/14 1337  Weight: 195 lb 4.8 oz (88.587 kg)    GENERAL:alert, no distress and comfortable SKIN: skin color, texture, turgor are normal, no rashes or significant lesions EYES: normal, Conjunctiva are pink and non-injected, sclera clear OROPHARYNX:no exudate, no erythema and lips, buccal mucosa, and tongue normal  NECK: supple, thyroid normal size, non-tender, without nodularity LYMPH:  no palpable lymphadenopathy in the cervical, axillary or inguinal LUNGS: clear to auscultation and percussion with normal breathing effort HEART: regular rate & rhythm and no murmurs and no lower extremity edema ABDOMEN:abdomen soft, non-tender and normal bowel sounds Musculoskeletal:no cyanosis of digits and no clubbing  NEURO: alert & oriented x 3 with fluent speech, no focal motor/sensory deficits  LABORATORY DATA:  I have reviewed the data as listed   Chemistry      Component Value Date/Time   NA 138 12/30/2014 1309   K 4.8 12/30/2014 1309   CO2 25 12/30/2014 1309   BUN 11.1 12/30/2014 1309   CREATININE 1.1 12/30/2014 1309      Component Value Date/Time   CALCIUM 9.4 12/30/2014 1309   ALKPHOS 73 12/30/2014 1309   AST 18 12/30/2014 1309   ALT 15 12/30/2014 1309   BILITOT 0.26 12/30/2014 1309       Lab Results  Component Value Date   WBC 3.8* 12/30/2014   HGB 9.8* 12/30/2014   HCT  31.2* 12/30/2014   MCV 81.8 12/30/2014   PLT 155 12/30/2014   NEUTROABS 2.0 12/30/2014   ASSESSMENT & PLAN:  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 Adjuvant chemotherapy with Taxotere and Cytoxan every 3 weeks 4 cycles 2. Followed by adjuvant radiation therapy Anemia: Normocytic anemia present even prior to starting chemotherapy with a hemoglobin of 10.5.  Current treatment: cycle 1 day 8 adjuvant Taxotere and Cytoxan  Chemotherapy side effects:  Diarrhea: managed with imodium PRN Back pain: pre-existing prior to chemotherapy, but possibly exaggerated by neulasta. Will take claritin for 5 days after neulasta injection. Plain films to be obtained today per daughter's request.  Diabetes mellitus: We will not be using dexamethasone   Return to clinic in 2 weeks for cycle 2 of treatment   Orders Placed This Encounter  Procedures  . DG Thoracic Spine 1 View    Standing Status: Future     Number of Occurrences: 1     Standing Expiration Date: 02/29/2016    Order Specific Question:  Reason for Exam (SYMPTOM  OR DIAGNOSIS REQUIRED)    Answer:  back pain in setting of breast cancer    Order Specific Question:  Preferred imaging location?    Answer:  [facility]   The patient has a good understanding of the overall plan. she agrees with it. she will call with any problems that may develop before the next visit here.   Laurie Panda, NP

## 2014-12-30 NOTE — Telephone Encounter (Signed)
Gave avs & calendar for August/September °

## 2015-01-13 ENCOUNTER — Other Ambulatory Visit: Payer: Self-pay | Admitting: *Deleted

## 2015-01-13 ENCOUNTER — Encounter: Payer: Self-pay | Admitting: Hematology and Oncology

## 2015-01-13 ENCOUNTER — Other Ambulatory Visit (HOSPITAL_BASED_OUTPATIENT_CLINIC_OR_DEPARTMENT_OTHER): Payer: Medicare Other

## 2015-01-13 ENCOUNTER — Telehealth: Payer: Self-pay | Admitting: Hematology and Oncology

## 2015-01-13 ENCOUNTER — Ambulatory Visit: Payer: Medicare Other

## 2015-01-13 ENCOUNTER — Other Ambulatory Visit: Payer: Self-pay | Admitting: Hematology and Oncology

## 2015-01-13 ENCOUNTER — Ambulatory Visit (HOSPITAL_BASED_OUTPATIENT_CLINIC_OR_DEPARTMENT_OTHER): Payer: Medicare Other | Admitting: Hematology and Oncology

## 2015-01-13 VITALS — BP 146/62 | HR 77 | Temp 98.5°F | Resp 18 | Ht 66.0 in | Wt 195.6 lb

## 2015-01-13 DIAGNOSIS — C50212 Malignant neoplasm of upper-inner quadrant of left female breast: Secondary | ICD-10-CM

## 2015-01-13 DIAGNOSIS — D649 Anemia, unspecified: Secondary | ICD-10-CM

## 2015-01-13 DIAGNOSIS — M545 Low back pain: Secondary | ICD-10-CM

## 2015-01-13 DIAGNOSIS — R197 Diarrhea, unspecified: Secondary | ICD-10-CM

## 2015-01-13 DIAGNOSIS — E119 Type 2 diabetes mellitus without complications: Secondary | ICD-10-CM | POA: Diagnosis not present

## 2015-01-13 DIAGNOSIS — L986 Other infiltrative disorders of the skin and subcutaneous tissue: Secondary | ICD-10-CM

## 2015-01-13 DIAGNOSIS — K521 Toxic gastroenteritis and colitis: Secondary | ICD-10-CM

## 2015-01-13 DIAGNOSIS — T451X5A Adverse effect of antineoplastic and immunosuppressive drugs, initial encounter: Secondary | ICD-10-CM

## 2015-01-13 LAB — CBC WITH DIFFERENTIAL/PLATELET
BASO%: 1.2 % (ref 0.0–2.0)
BASOS ABS: 0.1 10*3/uL (ref 0.0–0.1)
EOS%: 0.1 % (ref 0.0–7.0)
Eosinophils Absolute: 0 10*3/uL (ref 0.0–0.5)
HCT: 32.2 % — ABNORMAL LOW (ref 34.8–46.6)
HGB: 10.2 g/dL — ABNORMAL LOW (ref 11.6–15.9)
LYMPH%: 18.6 % (ref 14.0–49.7)
MCH: 26.2 pg (ref 25.1–34.0)
MCHC: 31.7 g/dL (ref 31.5–36.0)
MCV: 82.5 fL (ref 79.5–101.0)
MONO#: 0.6 10*3/uL (ref 0.1–0.9)
MONO%: 9.3 % (ref 0.0–14.0)
NEUT#: 4.9 10*3/uL (ref 1.5–6.5)
NEUT%: 70.8 % (ref 38.4–76.8)
PLATELETS: 268 10*3/uL (ref 145–400)
RBC: 3.91 10*6/uL (ref 3.70–5.45)
RDW: 15.3 % — ABNORMAL HIGH (ref 11.2–14.5)
WBC: 6.9 10*3/uL (ref 3.9–10.3)
lymph#: 1.3 10*3/uL (ref 0.9–3.3)

## 2015-01-13 LAB — COMPREHENSIVE METABOLIC PANEL (CC13)
ALT: 18 U/L (ref 0–55)
ANION GAP: 8 meq/L (ref 3–11)
AST: 21 U/L (ref 5–34)
Albumin: 3.9 g/dL (ref 3.5–5.0)
Alkaline Phosphatase: 76 U/L (ref 40–150)
BILIRUBIN TOTAL: 0.3 mg/dL (ref 0.20–1.20)
BUN: 25.8 mg/dL (ref 7.0–26.0)
CHLORIDE: 109 meq/L (ref 98–109)
CO2: 24 meq/L (ref 22–29)
Calcium: 9.5 mg/dL (ref 8.4–10.4)
Creatinine: 1.2 mg/dL — ABNORMAL HIGH (ref 0.6–1.1)
EGFR: 53 mL/min/{1.73_m2} — AB (ref >90)
Glucose: 119 mg/dl (ref 70–140)
Potassium: 4.9 mEq/L (ref 3.5–5.1)
Sodium: 141 mEq/L (ref 136–145)
TOTAL PROTEIN: 7.8 g/dL (ref 6.4–8.3)

## 2015-01-13 NOTE — Telephone Encounter (Signed)
Appointments made and avs will be printed in chemo  °

## 2015-01-13 NOTE — Assessment & Plan Note (Signed)
Left breast invasive lobular cancer 2.5 cm in size status post lumpectomy showing invasive lobular cancer, grade 3, multifocal, 2.5 cm and 0.25 cm, margins negative, ER 0%, PR 0%, HER-2 -1/11 micro-met (0.3 cm) Treatment plan: 1. Adjuvant chemotherapy with Taxotere and Cytoxan every 3 weeks 4 cycles 2. Followed by adjuvant radiation therapy Anemia: Normocytic anemia present even prior to starting chemotherapy with a hemoglobin of 10.5.  Current treatment: cycle 2 day 1 adjuvant Taxotere and Cytoxan Chemotherapy side effects:  1. Diarrhea: managed with imodium PRN 2. 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  Return to clinic in 3 weeks for cycle 3

## 2015-01-13 NOTE — Progress Notes (Signed)
Patient Care Team: Pcp Not In System as PCP - General  DIAGNOSIS: No matching staging information was found for the patient.  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 2+ by IHC, FISH negative ratio 1.3   11/26/2014 Surgery left lumpectomy invasive lobular cancer, grade 3, multifocal, 2.5 cm and 0.25 cm, margins negative, ER 0%, PR 0%, HER-2 -1/11 micro-met 0.3 cm   12/23/2014 -  Chemotherapy Adjuvant chemotherapy with Taxotere and Cytoxan every 3 weeks 4 cycles    CHIEF COMPLIANT:   Cycle 2 adjuvant chemotherapy with Taxotere and Cytoxan being held for (injection site infiltration)  INTERVAL HISTORY: Annette Meadows is a  74 year old lady with above-mentioned history of left breast cancer treated with lumpectomy and it was 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 She is currently on adjuvant chemotherapy with Taxotere and Cytoxan. Overall she tolerated cycle 1 fairly well except for occasional diarrhea. She is here today for cycle 2. Denies any neuropathy denies any nausea vomiting.  REVIEW OF SYSTEMS:   Constitutional: Denies fevers, chills or abnormal weight loss Eyes: Denies blurriness of vision Ears, nose, mouth, throat, and face: Denies mucositis or sore throat Respiratory: Denies cough, dyspnea or wheezes Cardiovascular: Denies palpitation, chest discomfort or lower extremity swelling Gastrointestinal:   diarrhea Skin: Denies abnormal skin rashes Lymphatics: Denies new lymphadenopathy or easy bruising Neurological:Denies numbness, tingling or new weaknesses Behavioral/Psych: Mood is stable, no new changes  Breast:  denies any pain or lumps or nodules in either breasts All other systems were reviewed with the patient and are negative.  I have reviewed the past medical history, past  surgical history, social history and family history with the patient and they are unchanged from previous note.  ALLERGIES:  has No Known Allergies.  MEDICATIONS:  Current Outpatient Prescriptions  Medication Sig Dispense Refill  . amLODipine-benazepril (LOTREL) 5-40 MG per capsule Take 1 capsule by mouth daily.    Marland Kitchen aspirin 81 MG tablet Take 81 mg by mouth daily.    Marland Kitchen atorvastatin (LIPITOR) 40 MG tablet Take 40 mg by mouth daily.    . carvedilol (COREG) 25 MG tablet Take 25 mg by mouth 2 (two) times daily with a meal.    . Cetirizine HCl (ZYRTEC ALLERGY PO) Take 10 mg by mouth daily.    . Cholecalciferol (VITAMIN D) 2000 UNITS tablet Take 2,000 Units by mouth daily.    . cholestyramine light (PREVALITE) 4 G packet Take 4 g by mouth daily.    . Diclofenac Sodium (PENNSAID) 2 % SOLN Place 1 spray onto the skin 2 (two) times daily.    . diclofenac sodium (VOLTAREN) 1 % GEL Apply topically 2 (two) times daily.    Marland Kitchen esomeprazole (NEXIUM) 40 MG capsule Take 40 mg by mouth daily at 12 noon.    . fluticasone (FLONASE) 50 MCG/ACT nasal spray Place 1 spray into both nostrils 2 (two) times daily.    Marland Kitchen gabapentin (NEURONTIN) 300 MG capsule Take 300 mg by mouth 3 (three) times daily.    . Insulin Glargine (LANTUS SOLOSTAR) 100 UNIT/ML Solostar Pen Inject 40 Units into the skin daily at 10 pm.    . levothyroxine (SYNTHROID, LEVOTHROID) 50 MCG tablet Take 50 mcg by mouth daily before breakfast.    . LORazepam (ATIVAN) 0.5 MG tablet Take 1 tablet (0.5 mg total) by  mouth at bedtime. 30 tablet 0  . montelukast (SINGULAIR) 10 MG tablet Take 10 mg by mouth at bedtime.    . ondansetron (ZOFRAN) 8 MG tablet Take 1 tablet (8 mg total) by mouth 2 (two) times daily. Start the day after chemo for 3 days. Then take as needed for nausea or vomiting. 30 tablet 1  . oxybutynin (DITROPAN-XL) 10 MG 24 hr tablet   0  . OXYBUTYNIN CHLORIDE ER PO Take 10 mg by mouth daily.    . prochlorperazine (COMPAZINE) 10 MG tablet Take  1 tablet (10 mg total) by mouth every 6 (six) hours as needed (Nausea or vomiting). 30 tablet 1  . SitaGLIPtin-MetFORMIN HCl (JANUMET XR) 50-1000 MG TB24 Take 1,000 mg by mouth 2 (two) times daily.    . traMADol (ULTRAM) 50 MG tablet Take by mouth every 6 (six) hours as needed.     No current facility-administered medications for this visit.    PHYSICAL EXAMINATION: ECOG PERFORMANCE STATUS: 1 - Symptomatic but completely ambulatory  Filed Vitals:   01/13/15 1317  BP: 146/62  Pulse: 77  Temp: 98.5 F (36.9 C)  Resp: 18   Filed Weights   01/13/15 1317  Weight: 195 lb 9.6 oz (88.724 kg)    GENERAL:alert, no distress and comfortable SKIN: skin color, texture, turgor are normal, no rashes or significant lesions EYES: normal, Conjunctiva are pink and non-injected, sclera clear OROPHARYNX:no exudate, no erythema and lips, buccal mucosa, and tongue normal  NECK: supple, thyroid normal size, non-tender, without nodularity LYMPH:  no palpable lymphadenopathy in the cervical, axillary or inguinal LUNGS: clear to auscultation and percussion with normal breathing effort HEART: regular rate & rhythm and no murmurs and no lower extremity edema ABDOMEN:abdomen soft, non-tender and normal bowel sounds Musculoskeletal:no cyanosis of digits and no clubbing  NEURO: alert & oriented x 3 with fluent speech, no focal motor/sensory deficits Skin: Right elbow shows extensive skin infiltration changes along with mild skin excoriation   LABORATORY DATA:  I have reviewed the data as listed   Chemistry      Component Value Date/Time   NA 141 01/13/2015 1209   K 4.9 01/13/2015 1209   CO2 24 01/13/2015 1209   BUN 25.8 01/13/2015 1209   CREATININE 1.2* 01/13/2015 1209      Component Value Date/Time   CALCIUM 9.5 01/13/2015 1209   ALKPHOS 76 01/13/2015 1209   AST 21 01/13/2015 1209   ALT 18 01/13/2015 1209   BILITOT 0.30 01/13/2015 1209       Lab Results  Component Value Date   WBC 6.9  01/13/2015   HGB 10.2* 01/13/2015   HCT 32.2* 01/13/2015   MCV 82.5 01/13/2015   PLT 268 01/13/2015   NEUTROABS 4.9 01/13/2015   ASSESSMENT & PLAN:  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 Adjuvant chemotherapy with Taxotere and Cytoxan every 3 weeks 4 cycles 2. Followed by adjuvant radiation therapy Anemia: Normocytic anemia present even prior to starting chemotherapy with a hemoglobin of 10.5.  Current treatment: cycle 2 day 1 adjuvant Taxotere and Cytoxan (being held for lack of good IV access) Chemotherapy side effects:  1. Diarrhea: managed with imodium PRN 2. Back pain 3. Infusion site infiltration of skin: We will hold off on giving  second cycle of chemotherapy today and set her up for a port placement and delay chemotherapy by a week 4. 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  Return to clinic in 1 week for cycle 2   Orders Placed This Encounter  Procedures  . IR Fluoro Guide CV Line Right    Indicate type of CVC ordering    Standing Status: Future     Number of Occurrences:      Standing Expiration Date: 03/14/2016    Order Specific Question:  Reason for exam:    Answer:  Need IV access for chemo: Port requested    Order Specific Question:  Preferred Imaging Location?    Answer:  [facility]   The patient has a good understanding of the overall plan. she agrees with it. she will call with any problems that may develop before the next visit here.   Rulon Eisenmenger, MD

## 2015-01-14 ENCOUNTER — Other Ambulatory Visit: Payer: Self-pay | Admitting: *Deleted

## 2015-01-14 ENCOUNTER — Telehealth: Payer: Self-pay | Admitting: *Deleted

## 2015-01-14 DIAGNOSIS — C50212 Malignant neoplasm of upper-inner quadrant of left female breast: Secondary | ICD-10-CM

## 2015-01-14 MED ORDER — LORAZEPAM 0.5 MG PO TABS: 0.5000 mg | ORAL_TABLET | Freq: Every day | ORAL | Status: DC

## 2015-01-14 NOTE — Telephone Encounter (Signed)
[facility] in [place]. Confirmed with Dr. Sherie Don nurse that patient is scheduled for port placement on 9/2. Asked her to provide patient with documentation of tip placement so that we will be able to use port.

## 2015-01-14 NOTE — Telephone Encounter (Signed)
Called IR to see when appt for port placement was scheduled. Tiffany spoke with patient and patient told her that she was going to get port placed by her surgeon on 9/2 as IR first availability was 9/9. Called patient to confirm but no answer, left message for patient to call clinic to verify this appt so that chemo can be scheduled.  Attempted to call [phone] but voice mail full.

## 2015-01-15 ENCOUNTER — Other Ambulatory Visit: Payer: Self-pay | Admitting: Hematology and Oncology

## 2015-01-21 ENCOUNTER — Other Ambulatory Visit: Payer: Self-pay | Admitting: *Deleted

## 2015-01-21 ENCOUNTER — Ambulatory Visit (HOSPITAL_BASED_OUTPATIENT_CLINIC_OR_DEPARTMENT_OTHER): Payer: Medicare Other

## 2015-01-21 ENCOUNTER — Ambulatory Visit (HOSPITAL_BASED_OUTPATIENT_CLINIC_OR_DEPARTMENT_OTHER): Payer: Medicare Other | Admitting: Nurse Practitioner

## 2015-01-21 ENCOUNTER — Other Ambulatory Visit (HOSPITAL_BASED_OUTPATIENT_CLINIC_OR_DEPARTMENT_OTHER): Payer: Medicare Other

## 2015-01-21 ENCOUNTER — Encounter: Payer: Self-pay | Admitting: Nurse Practitioner

## 2015-01-21 ENCOUNTER — Telehealth: Payer: Self-pay | Admitting: Nurse Practitioner

## 2015-01-21 VITALS — BP 143/70 | HR 77 | Temp 99.4°F | Resp 17

## 2015-01-21 VITALS — BP 162/66 | HR 78 | Temp 99.6°F | Resp 18 | Ht 66.0 in | Wt 195.7 lb

## 2015-01-21 DIAGNOSIS — Z5189 Encounter for other specified aftercare: Secondary | ICD-10-CM | POA: Diagnosis not present

## 2015-01-21 DIAGNOSIS — C50212 Malignant neoplasm of upper-inner quadrant of left female breast: Secondary | ICD-10-CM

## 2015-01-21 DIAGNOSIS — Z5111 Encounter for antineoplastic chemotherapy: Secondary | ICD-10-CM | POA: Diagnosis not present

## 2015-01-21 LAB — CBC WITH DIFFERENTIAL/PLATELET
BASO%: 0.3 % (ref 0.0–2.0)
Basophils Absolute: 0 10*3/uL (ref 0.0–0.1)
EOS ABS: 0.1 10*3/uL (ref 0.0–0.5)
EOS%: 1.8 % (ref 0.0–7.0)
HEMATOCRIT: 31.1 % — AB (ref 34.8–46.6)
HEMOGLOBIN: 9.7 g/dL — AB (ref 11.6–15.9)
LYMPH%: 17.2 % (ref 14.0–49.7)
MCH: 26.1 pg (ref 25.1–34.0)
MCHC: 31.2 g/dL — ABNORMAL LOW (ref 31.5–36.0)
MCV: 83.8 fL (ref 79.5–101.0)
MONO#: 0.4 10*3/uL (ref 0.1–0.9)
MONO%: 6 % (ref 0.0–14.0)
NEUT%: 74.7 % (ref 38.4–76.8)
NEUTROS ABS: 5.1 10*3/uL (ref 1.5–6.5)
PLATELETS: 192 10*3/uL (ref 145–400)
RBC: 3.71 10*6/uL (ref 3.70–5.45)
RDW: 14.9 % — AB (ref 11.2–14.5)
WBC: 6.8 10*3/uL (ref 3.9–10.3)
lymph#: 1.2 10*3/uL (ref 0.9–3.3)

## 2015-01-21 LAB — COMPREHENSIVE METABOLIC PANEL (CC13)
ALT: 12 U/L (ref 0–55)
ANION GAP: 10 meq/L (ref 3–11)
AST: 13 U/L (ref 5–34)
Albumin: 3.5 g/dL (ref 3.5–5.0)
Alkaline Phosphatase: 79 U/L (ref 40–150)
BUN: 14.1 mg/dL (ref 7.0–26.0)
CALCIUM: 9.1 mg/dL (ref 8.4–10.4)
CHLORIDE: 108 meq/L (ref 98–109)
CO2: 22 meq/L (ref 22–29)
Creatinine: 0.9 mg/dL (ref 0.6–1.1)
EGFR: 69 mL/min/{1.73_m2} — ABNORMAL LOW (ref >90)
Glucose: 151 mg/dl — ABNORMAL HIGH (ref 70–140)
POTASSIUM: 4.7 meq/L (ref 3.5–5.1)
Sodium: 140 mEq/L (ref 136–145)
Total Bilirubin: 0.35 mg/dL (ref 0.20–1.20)
Total Protein: 7.4 g/dL (ref 6.4–8.3)

## 2015-01-21 MED ORDER — PALONOSETRON HCL INJECTION 0.25 MG/5ML
INTRAVENOUS | Status: AC
  Filled 2015-01-21: qty 5

## 2015-01-21 MED ORDER — SODIUM CHLORIDE 0.9 % IV SOLN
495.0000 mg/m2 | Freq: Once | INTRAVENOUS | Status: AC
  Administered 2015-01-21: 1000 mg via INTRAVENOUS
  Filled 2015-01-21: qty 50

## 2015-01-21 MED ORDER — PALONOSETRON HCL INJECTION 0.25 MG/5ML
0.2500 mg | Freq: Once | INTRAVENOUS | Status: AC
  Administered 2015-01-21: 0.25 mg via INTRAVENOUS

## 2015-01-21 MED ORDER — DEXTROSE 5 % IV SOLN
50.0000 mg/m2 | Freq: Once | INTRAVENOUS | Status: AC
  Administered 2015-01-21: 100 mg via INTRAVENOUS
  Filled 2015-01-21: qty 10

## 2015-01-21 MED ORDER — SODIUM CHLORIDE 0.9 % IJ SOLN
10.0000 mL | INTRAMUSCULAR | Status: DC | PRN
  Administered 2015-01-21: 10 mL
  Filled 2015-01-21: qty 10

## 2015-01-21 MED ORDER — SODIUM CHLORIDE 0.9 % IV SOLN
Freq: Once | INTRAVENOUS | Status: AC
  Administered 2015-01-21: 12:00:00 via INTRAVENOUS

## 2015-01-21 MED ORDER — LIDOCAINE-PRILOCAINE 2.5-2.5 % EX KIT: PACK | Freq: Once | CUTANEOUS | Status: DC

## 2015-01-21 MED ORDER — HEPARIN SOD (PORK) LOCK FLUSH 100 UNIT/ML IV SOLN
500.0000 [IU] | Freq: Once | INTRAVENOUS | Status: AC | PRN
  Administered 2015-01-21: 500 [IU]
  Filled 2015-01-21: qty 5

## 2015-01-21 MED ORDER — PEGFILGRASTIM 6 MG/0.6ML ~~LOC~~ PSKT
6.0000 mg | PREFILLED_SYRINGE | Freq: Once | SUBCUTANEOUS | Status: AC
  Administered 2015-01-21: 6 mg via SUBCUTANEOUS
  Filled 2015-01-21: qty 0.6

## 2015-01-21 MED ORDER — SODIUM CHLORIDE 0.9 % IV SOLN
Freq: Once | INTRAVENOUS | Status: AC
  Administered 2015-01-21: 12:00:00 via INTRAVENOUS
  Filled 2015-01-21: qty 5

## 2015-01-21 NOTE — Progress Notes (Signed)
Patient had Port-a-cath placed @ [facility] in [place], [place] on 01/16/15.  Office called with confirmation of placement.  Report to be faxed to our office asap.

## 2015-01-21 NOTE — Telephone Encounter (Signed)
Gave avs & calendar for September °

## 2015-01-21 NOTE — Patient Instructions (Signed)
[facility] Discharge Instructions for Patients Receiving Chemotherapy  Today you received the following chemotherapy agents Taxotere, Cytoxan  To help prevent nausea and vomiting after your treatment, we encourage you to take your nausea medication     If you develop nausea and vomiting that is not controlled by your nausea medication, call the clinic.   BELOW ARE SYMPTOMS THAT SHOULD BE REPORTED IMMEDIATELY:  *FEVER GREATER THAN 100.5 F  *CHILLS WITH OR WITHOUT FEVER  NAUSEA AND VOMITING THAT IS NOT CONTROLLED WITH YOUR NAUSEA MEDICATION  *UNUSUAL SHORTNESS OF BREATH  *UNUSUAL BRUISING OR BLEEDING  TENDERNESS IN MOUTH AND THROAT WITH OR WITHOUT PRESENCE OF ULCERS  *URINARY PROBLEMS  *BOWEL PROBLEMS  UNUSUAL RASH Items with * indicate a potential emergency and should be followed up as soon as possible.  Feel free to call the clinic you have any questions or concerns. The clinic phone number is [phone].  Please show the [facility] at check-in to the Emergency Department and triage nurse.  Docetaxel injection What is this medicine? DOCETAXEL (doe se TAX el) is a chemotherapy drug. It targets fast dividing cells, like cancer cells, and causes these cells to die. This medicine is used to treat many types of cancers like breast cancer, certain stomach cancers, head and neck cancer, lung cancer, and prostate cancer. This medicine may be used for other purposes; ask your health care provider or pharmacist if you have questions. COMMON BRAND NAME(S): Docefrez, Taxotere What should I tell my health care provider before I take this medicine? They need to know if you have any of these conditions: -infection (especially a virus infection such as chickenpox, cold sores, or herpes) -liver disease -low blood counts, like low white cell, platelet, or red cell counts -an unusual or allergic reaction to docetaxel, polysorbate 80, other chemotherapy agents, other  medicines, foods, dyes, or preservatives -pregnant or trying to get pregnant -breast-feeding How should I use this medicine? This drug is given as an infusion into a vein. It is administered in a hospital or clinic by a specially trained health care professional. Talk to your pediatrician regarding the use of this medicine in children. Special care may be needed. Overdosage: If you think you have taken too much of this medicine contact a poison control center or emergency room at once. NOTE: This medicine is only for you. Do not share this medicine with others. What if I miss a dose? It is important not to miss your dose. Call your doctor or health care professional if you are unable to keep an appointment. What may interact with this medicine? -cyclosporine -erythromycin -ketoconazole -medicines to increase blood counts like filgrastim, pegfilgrastim, sargramostim -vaccines Talk to your doctor or health care professional before taking any of these medicines: -acetaminophen -aspirin -ibuprofen -ketoprofen -naproxen This list may not describe all possible interactions. Give your health care provider a list of all the medicines, herbs, non-prescription drugs, or dietary supplements you use. Also tell them if you smoke, drink alcohol, or use illegal drugs. Some items may interact with your medicine. What should I watch for while using this medicine? Your condition will be monitored carefully while you are receiving this medicine. You will need important blood work done while you are taking this medicine. This drug may make you feel generally unwell. This is not uncommon, as chemotherapy can affect healthy cells as well as cancer cells. Report any side effects. Continue your course of treatment even though you feel ill unless  your doctor tells you to stop. In some cases, you may be given additional medicines to help with side effects. Follow all directions for their use. Call your doctor or  health care professional for advice if you get a fever, chills or sore throat, or other symptoms of a cold or flu. Do not treat yourself. This drug decreases your body's ability to fight infections. Try to avoid being around people who are sick. This medicine may increase your risk to bruise or bleed. Call your doctor or health care professional if you notice any unusual bleeding. Be careful brushing and flossing your teeth or using a toothpick because you may get an infection or bleed more easily. If you have any dental work done, tell your dentist you are receiving this medicine. Avoid taking products that contain aspirin, acetaminophen, ibuprofen, naproxen, or ketoprofen unless instructed by your doctor. These medicines may hide a fever. This medicine contains an alcohol in the product. You may get drowsy or dizzy. Do not drive, use machinery, or do anything that needs mental alertness until you know how this medicine affects you. Do not stand or sit up quickly, especially if you are an older patient. This reduces the risk of dizzy or fainting spells. Avoid alcoholic drinks Do not become pregnant while taking this medicine. Women should inform their doctor if they wish to become pregnant or think they might be pregnant. There is a potential for serious side effects to an unborn child. Talk to your health care professional or pharmacist for more information. Do not breast-feed an infant while taking this medicine. What side effects may I notice from receiving this medicine? Side effects that you should report to your doctor or health care professional as soon as possible: -allergic reactions like skin rash, itching or hives, swelling of the face, lips, or tongue -low blood counts - This drug may decrease the number of white blood cells, red blood cells and platelets. You may be at increased risk for infections and bleeding. -signs of infection - fever or chills, cough, sore throat, pain or difficulty  passing urine -signs of decreased platelets or bleeding - bruising, pinpoint red spots on the skin, black, tarry stools, nosebleeds -signs of decreased red blood cells - unusually weak or tired, fainting spells, lightheadedness -breathing problems -fast or irregular heartbeat -low blood pressure -mouth sores -nausea and vomiting -pain, swelling, redness or irritation at the injection site -pain, tingling, numbness in the hands or feet -swelling of the ankle, feet, hands -weight gain Side effects that usually do not require medical attention (report to your prescriber or health care professional if they continue or are bothersome): -bone pain -complete hair loss including hair on your head, underarms, pubic hair, eyebrows, and eyelashes -diarrhea -excessive tearing -changes in the color of fingernails -loosening of the fingernails -nausea -muscle pain -red flush to skin -sweating -weak or tired This list may not describe all possible side effects. Call your doctor for medical advice about side effects. You may report side effects to FDA at 1-800-FDA-1088. Where should I keep my medicine? This drug is given in a hospital or clinic and will not be stored at home. NOTE: This sheet is a summary. It may not cover all possible information. If you have questions about this medicine, talk to your doctor, pharmacist, or health care provider.  2015, Elsevier/Gold Standard. (2013-03-28 22:21:02)     Cyclophosphamide injection What is this medicine? CYCLOPHOSPHAMIDE (sye kloe FOSS fa mide) is a chemotherapy drug. It slows  the growth of cancer cells. This medicine is used to treat many types of cancer like lymphoma, myeloma, leukemia, breast cancer, and ovarian cancer, to name a few. This medicine may be used for other purposes; ask your health care provider or pharmacist if you have questions. COMMON BRAND NAME(S): Cytoxan, Neosar What should I tell my health care provider before I take this  medicine? They need to know if you have any of these conditions: -blood disorders -history of other chemotherapy -infection -kidney disease -liver disease -recent or ongoing radiation therapy -tumors in the bone marrow -an unusual or allergic reaction to cyclophosphamide, other chemotherapy, other medicines, foods, dyes, or preservatives -pregnant or trying to get pregnant -breast-feeding How should I use this medicine? This drug is usually given as an injection into a vein or muscle or by infusion into a vein. It is administered in a hospital or clinic by a specially trained health care professional. Talk to your pediatrician regarding the use of this medicine in children. Special care may be needed. Overdosage: If you think you have taken too much of this medicine contact a poison control center or emergency room at once. NOTE: This medicine is only for you. Do not share this medicine with others. What if I miss a dose? It is important not to miss your dose. Call your doctor or health care professional if you are unable to keep an appointment. What may interact with this medicine? This medicine may interact with the following medications: -amiodarone -amphotericin B -azathioprine -certain antiviral medicines for HIV or AIDS such as protease inhibitors (e.g., indinavir, ritonavir) and zidovudine -certain blood pressure medications such as benazepril, captopril, enalapril, fosinopril, lisinopril, moexipril, monopril, perindopril, quinapril, ramipril, trandolapril -certain cancer medications such as anthracyclines (e.g., daunorubicin, doxorubicin), busulfan, cytarabine, paclitaxel, pentostatin, tamoxifen, trastuzumab -certain diuretics such as chlorothiazide, chlorthalidone, hydrochlorothiazide, indapamide, metolazone -certain medicines that treat or prevent blood clots like warfarin -certain muscle relaxants such as succinylcholine -cyclosporine -etanercept -indomethacin -medicines  to increase blood counts like filgrastim, pegfilgrastim, sargramostim -medicines used as general anesthesia -metronidazole -natalizumab This list may not describe all possible interactions. Give your health care provider a list of all the medicines, herbs, non-prescription drugs, or dietary supplements you use. Also tell them if you smoke, drink alcohol, or use illegal drugs. Some items may interact with your medicine. What should I watch for while using this medicine? Visit your doctor for checks on your progress. This drug may make you feel generally unwell. This is not uncommon, as chemotherapy can affect healthy cells as well as cancer cells. Report any side effects. Continue your course of treatment even though you feel ill unless your doctor tells you to stop. Drink water or other fluids as directed. Urinate often, even at night. In some cases, you may be given additional medicines to help with side effects. Follow all directions for their use. Call your doctor or health care professional for advice if you get a fever, chills or sore throat, or other symptoms of a cold or flu. Do not treat yourself. This drug decreases your body's ability to fight infections. Try to avoid being around people who are sick. This medicine may increase your risk to bruise or bleed. Call your doctor or health care professional if you notice any unusual bleeding. Be careful brushing and flossing your teeth or using a toothpick because you may get an infection or bleed more easily. If you have any dental work done, tell your dentist you are receiving this medicine. You  may get drowsy or dizzy. Do not drive, use machinery, or do anything that needs mental alertness until you know how this medicine affects you. Do not become pregnant while taking this medicine or for 1 year after stopping it. Women should inform their doctor if they wish to become pregnant or think they might be pregnant. Men should not father a child  while taking this medicine and for 4 months after stopping it. There is a potential for serious side effects to an unborn child. Talk to your health care professional or pharmacist for more information. Do not breast-feed an infant while taking this medicine. This medicine may interfere with the ability to have a child. This medicine has caused ovarian failure in some women. This medicine has caused reduced sperm counts in some men. You should talk with your doctor or health care professional if you are concerned about your fertility. If you are going to have surgery, tell your doctor or health care professional that you have taken this medicine. What side effects may I notice from receiving this medicine? Side effects that you should report to your doctor or health care professional as soon as possible: -allergic reactions like skin rash, itching or hives, swelling of the face, lips, or tongue -low blood counts - this medicine may decrease the number of white blood cells, red blood cells and platelets. You may be at increased risk for infections and bleeding. -signs of infection - fever or chills, cough, sore throat, pain or difficulty passing urine -signs of decreased platelets or bleeding - bruising, pinpoint red spots on the skin, black, tarry stools, blood in the urine -signs of decreased red blood cells - unusually weak or tired, fainting spells, lightheadedness -breathing problems -dark urine -dizziness -palpitations -swelling of the ankles, feet, hands -trouble passing urine or change in the amount of urine -weight gain -yellowing of the eyes or skin Side effects that usually do not require medical attention (report to your doctor or health care professional if they continue or are bothersome): -changes in nail or skin color -hair loss -missed menstrual periods -mouth sores -nausea, vomiting This list may not describe all possible side effects. Call your doctor for medical advice  about side effects. You may report side effects to FDA at 1-800-FDA-1088. Where should I keep my medicine? This drug is given in a hospital or clinic and will not be stored at home. NOTE: This sheet is a summary. It may not cover all possible information. If you have questions about this medicine, talk to your doctor, pharmacist, or health care provider.  2015, Elsevier/Gold Standard. (2012-03-16 16:22:58)

## 2015-01-21 NOTE — Progress Notes (Signed)
Patient Care Team: Pcp Not In System as PCP - General  DIAGNOSIS: No matching staging information was found for the patient.  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 She is currently on adjuvant chemotherapy with Taxotere and Cytoxan. Cycle 2 was held as we awaited a port placement after venous infiltration with the 1st cycle. This was done on 9/2 at [facility] in [place], [place]. She tolerated this procedure well with no complications. She was given records of the chest xray confirming placement, but forgot them at home. She has no complaints to offer today.  REVIEW OF SYSTEMS:   Constitutional: Denies fevers, chills or abnormal weight loss Eyes: Denies blurriness of vision Ears, nose, mouth, throat, and face: Denies mucositis or sore throat Respiratory: Denies cough, dyspnea or wheezes Cardiovascular: Denies palpitation, chest discomfort or lower extremity swelling Gastrointestinal:   diarrhea Skin: Denies abnormal skin rashes Lymphatics: Denies new lymphadenopathy or easy bruising Neurological:Denies numbness, tingling or new weaknesses Behavioral/Psych: Mood is stable, no new changes  Breast:  denies any pain or lumps or  nodules in either breasts All other systems were reviewed with the patient and are negative.  I have reviewed the past medical history, past surgical history, social history and family history with the patient and they are unchanged from previous note.  ALLERGIES:  has No Known Allergies.  MEDICATIONS:  Current Outpatient Prescriptions  Medication Sig Dispense Refill  . amLODipine-benazepril (LOTREL) 5-40 MG per capsule Take 1 capsule by mouth daily.    Marland Kitchen aspirin 81 MG tablet Take 81 mg by mouth daily.    Marland Kitchen atorvastatin (LIPITOR) 40 MG tablet Take 40 mg by mouth daily.    . carvedilol (COREG) 25 MG tablet Take 25 mg by mouth 2 (two) times daily with a meal.    . Cetirizine HCl (ZYRTEC ALLERGY PO) Take 10 mg by mouth daily.    . Cholecalciferol (VITAMIN D) 2000 UNITS tablet Take 2,000 Units by mouth daily.    . cholestyramine light (PREVALITE) 4 G packet Take 4 g by mouth daily.    . Diclofenac Sodium (PENNSAID) 2 % SOLN Place 1 spray onto the skin 2 (two) times daily.    . diclofenac sodium (VOLTAREN) 1 % GEL Apply topically 2 (two) times daily.    Marland Kitchen esomeprazole (NEXIUM) 40 MG capsule Take 40 mg by mouth daily at 12 noon.    . fluticasone (FLONASE) 50 MCG/ACT nasal spray Place 1 spray into both nostrils 2 (two) times daily.    Marland Kitchen gabapentin (NEURONTIN) 300 MG capsule Take 300 mg by mouth 3 (three) times daily.    . Insulin Glargine (LANTUS SOLOSTAR) 100 UNIT/ML Solostar Pen Inject 40 Units into the skin daily at 10 pm.    . levothyroxine (SYNTHROID,  LEVOTHROID) 50 MCG tablet Take 50 mcg by mouth daily before breakfast.    . LORazepam (ATIVAN) 0.5 MG tablet Take 1 tablet (0.5 mg total) by mouth at bedtime. 30 tablet 0  . montelukast (SINGULAIR) 10 MG tablet Take 10 mg by mouth at bedtime.    . ondansetron (ZOFRAN) 8 MG tablet Take 1 tablet (8 mg total) by mouth 2 (two) times daily. Start the day after chemo for 3 days. Then take as needed for nausea or vomiting. 30 tablet 1  . oxybutynin  (DITROPAN-XL) 10 MG 24 hr tablet   0  . OXYBUTYNIN CHLORIDE ER PO Take 10 mg by mouth daily.    . prochlorperazine (COMPAZINE) 10 MG tablet Take 1 tablet (10 mg total) by mouth every 6 (six) hours as needed (Nausea or vomiting). 30 tablet 1  . SitaGLIPtin-MetFORMIN HCl (JANUMET XR) 50-1000 MG TB24 Take 1,000 mg by mouth 2 (two) times daily.    . traMADol (ULTRAM) 50 MG tablet Take by mouth every 6 (six) hours as needed.     No current facility-administered medications for this visit.    PHYSICAL EXAMINATION: ECOG PERFORMANCE STATUS: 1 - Symptomatic but completely ambulatory  Filed Vitals:   01/21/15 1026  BP: 162/66  Pulse: 78  Temp: 99.6 F (37.6 C)  Resp: 18   Filed Weights   01/21/15 1026  Weight: 195 lb 11.2 oz (88.769 kg)    GENERAL:alert, no distress and comfortable SKIN: skin color, texture, turgor are normal, no rashes or significant lesions EYES: normal, Conjunctiva are pink and non-injected, sclera clear OROPHARYNX:no exudate, no erythema and lips, buccal mucosa, and tongue normal  NECK: supple, thyroid normal size, non-tender, without nodularity LYMPH:  no palpable lymphadenopathy in the cervical, axillary or inguinal LUNGS: clear to auscultation and percussion with normal breathing effort HEART: regular rate & rhythm and no murmurs and no lower extremity edema ABDOMEN:abdomen soft, non-tender and normal bowel sounds Musculoskeletal:no cyanosis of digits and no clubbing  NEURO: alert & oriented x 3 with fluent speech, no focal motor/sensory deficits Skin: Right elbow shows extensive skin infiltration changes along with mild skin excoriation   LABORATORY DATA:  I have reviewed the data as listed   Chemistry      Component Value Date/Time   NA 141 01/13/2015 1209   K 4.9 01/13/2015 1209   CO2 24 01/13/2015 1209   BUN 25.8 01/13/2015 1209   CREATININE 1.2* 01/13/2015 1209      Component Value Date/Time   CALCIUM 9.5 01/13/2015 1209   ALKPHOS 76 01/13/2015  1209   AST 21 01/13/2015 1209   ALT 18 01/13/2015 1209   BILITOT 0.30 01/13/2015 1209       Lab Results  Component Value Date   WBC 6.8 01/21/2015   HGB 9.7* 01/21/2015   HCT 31.1* 01/21/2015   MCV 83.8 01/21/2015   PLT 192 01/21/2015   NEUTROABS 5.1 01/21/2015   ASSESSMENT & PLAN:  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 Adjuvant chemotherapy with Taxotere and Cytoxan every 3 weeks 4 cycles 2. Followed by adjuvant radiation therapy Anemia: Normocytic anemia present even prior to starting chemotherapy with a hemoglobin of 10.5.  Current treatment: cycle 2 day 1 adjuvant Taxotere and Cytoxan  Chemotherapy side effects:  1. Diarrhea: managed with imodium PRN 2. Back pain 3. Infusion site infiltration of skin: port placed 9/2 at [facility] in [place], [place] 4. 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  Return to clinic in 1 week for labs and a nadir visit  No orders of the defined types were placed in this encounter.   The patient has a good understanding of the overall plan. she agrees with it. she will call with any problems that may develop before the next visit here.   Laurie Panda, NP

## 2015-01-21 NOTE — Progress Notes (Signed)
Received fax report on patient confirming tip placement to be in the SVC.  Report sent to scanning.

## 2015-01-29 ENCOUNTER — Telehealth: Payer: Self-pay | Admitting: Hematology and Oncology

## 2015-01-29 ENCOUNTER — Encounter: Payer: Self-pay | Admitting: Hematology and Oncology

## 2015-01-29 ENCOUNTER — Telehealth: Payer: Self-pay | Admitting: *Deleted

## 2015-01-29 ENCOUNTER — Other Ambulatory Visit (HOSPITAL_BASED_OUTPATIENT_CLINIC_OR_DEPARTMENT_OTHER): Payer: Medicare Other

## 2015-01-29 ENCOUNTER — Ambulatory Visit (HOSPITAL_BASED_OUTPATIENT_CLINIC_OR_DEPARTMENT_OTHER): Payer: Medicare Other | Admitting: Hematology and Oncology

## 2015-01-29 VITALS — BP 144/60 | HR 86 | Temp 98.6°F | Resp 18 | Ht 66.0 in | Wt 194.2 lb

## 2015-01-29 DIAGNOSIS — T451X5A Adverse effect of antineoplastic and immunosuppressive drugs, initial encounter: Secondary | ICD-10-CM

## 2015-01-29 DIAGNOSIS — K521 Toxic gastroenteritis and colitis: Secondary | ICD-10-CM

## 2015-01-29 DIAGNOSIS — C50212 Malignant neoplasm of upper-inner quadrant of left female breast: Secondary | ICD-10-CM | POA: Diagnosis present

## 2015-01-29 LAB — CBC WITH DIFFERENTIAL/PLATELET
BASO%: 0.4 % (ref 0.0–2.0)
BASOS ABS: 0 10*3/uL (ref 0.0–0.1)
EOS ABS: 0.1 10*3/uL (ref 0.0–0.5)
EOS%: 0.5 % (ref 0.0–7.0)
HEMATOCRIT: 29.4 % — AB (ref 34.8–46.6)
HEMOGLOBIN: 9.3 g/dL — AB (ref 11.6–15.9)
LYMPH#: 1.3 10*3/uL (ref 0.9–3.3)
LYMPH%: 11.6 % — ABNORMAL LOW (ref 14.0–49.7)
MCH: 26.6 pg (ref 25.1–34.0)
MCHC: 31.6 g/dL (ref 31.5–36.0)
MCV: 84 fL (ref 79.5–101.0)
MONO#: 1.1 10*3/uL — AB (ref 0.1–0.9)
MONO%: 9.8 % (ref 0.0–14.0)
NEUT#: 8.6 10*3/uL — ABNORMAL HIGH (ref 1.5–6.5)
NEUT%: 77.7 % — ABNORMAL HIGH (ref 38.4–76.8)
PLATELETS: 153 10*3/uL (ref 145–400)
RBC: 3.5 10*6/uL — ABNORMAL LOW (ref 3.70–5.45)
RDW: 14.5 % (ref 11.2–14.5)
WBC: 11 10*3/uL — ABNORMAL HIGH (ref 3.9–10.3)

## 2015-01-29 LAB — COMPREHENSIVE METABOLIC PANEL (CC13)
ALBUMIN: 3.6 g/dL (ref 3.5–5.0)
ALK PHOS: 97 U/L (ref 40–150)
ALT: 10 U/L (ref 0–55)
AST: 18 U/L (ref 5–34)
Anion Gap: 9 mEq/L (ref 3–11)
BILIRUBIN TOTAL: 0.2 mg/dL (ref 0.20–1.20)
BUN: 13.8 mg/dL (ref 7.0–26.0)
CALCIUM: 9.3 mg/dL (ref 8.4–10.4)
CO2: 27 mEq/L (ref 22–29)
Chloride: 105 mEq/L (ref 98–109)
Creatinine: 1.2 mg/dL — ABNORMAL HIGH (ref 0.6–1.1)
EGFR: 51 mL/min/{1.73_m2} — AB (ref >90)
Glucose: 108 mg/dl (ref 70–140)
POTASSIUM: 4.5 meq/L (ref 3.5–5.1)
Sodium: 141 mEq/L (ref 136–145)
TOTAL PROTEIN: 7.6 g/dL (ref 6.4–8.3)

## 2015-01-29 NOTE — Progress Notes (Signed)
Patient Care Team: Pcp Not In System as PCP - General  DIAGNOSIS: No matching staging information was found for the patient.  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 Today is cycle 2 day 8. She had done extremely well with cycle 2. Because we have a report she did not have any major skin reactions. Skin infiltration of cycle one is still profound. She does not have any pain or discomfort at that site at this time. Denies any nausea or vomiting. She did have hair loss.  REVIEW OF SYSTEMS:   Constitutional: Denies fevers, chills or abnormal weight loss, hair loss, decreased appetite Eyes: Denies blurriness of vision Ears, nose, mouth, throat, and face: Denies mucositis or sore throat Respiratory: Denies cough, dyspnea or wheezes Cardiovascular: Denies palpitation, chest discomfort or lower extremity swelling Gastrointestinal:  Denies nausea, heartburn or change in bowel habits Skin: Denies abnormal skin rashes Lymphatics: Denies new lymphadenopathy or easy bruising Neurological:Denies numbness, tingling or new weaknesses Behavioral/Psych: Mood is stable, no new changes   All other systems were reviewed with the patient and are negative.  I have reviewed the past medical history,  past surgical history, social history and family history with the patient and they are unchanged from previous note.  ALLERGIES:  has No Known Allergies.  MEDICATIONS:  Current Outpatient Prescriptions  Medication Sig Dispense Refill  . amLODipine-benazepril (LOTREL) 5-40 MG per capsule Take 1 capsule by mouth daily.    Marland Kitchen aspirin 81 MG tablet Take 81 mg by mouth daily.    Marland Kitchen atorvastatin (LIPITOR) 40 MG tablet Take 40 mg by mouth daily.    . carvedilol (COREG) 25 MG tablet Take 25 mg by mouth 2 (two) times daily with a meal.    . Cetirizine HCl (ZYRTEC ALLERGY PO) Take 10 mg by mouth daily.    . Cholecalciferol (VITAMIN D) 2000 UNITS tablet Take 2,000 Units by mouth daily.    . cholestyramine light (PREVALITE) 4 G packet Take 4 g by mouth daily.    . Diclofenac Sodium (PENNSAID) 2 % SOLN Place 1 spray onto the skin 2 (two) times daily.    . diclofenac sodium (VOLTAREN) 1 % GEL Apply topically 2 (two) times daily.    Marland Kitchen esomeprazole (NEXIUM) 40 MG capsule Take 40 mg by mouth daily at 12 noon.    . fluticasone (FLONASE) 50 MCG/ACT nasal spray Place 1 spray into both nostrils 2 (two) times daily.    Marland Kitchen gabapentin (NEURONTIN) 300 MG capsule Take 300 mg by mouth 3 (three) times daily.    . Insulin Glargine (LANTUS SOLOSTAR) 100 UNIT/ML Solostar Pen Inject 40 Units into the skin daily at 10 pm.    . levothyroxine (SYNTHROID, LEVOTHROID) 50 MCG tablet Take 50 mcg by mouth daily before breakfast.    . lidocaine-prilocaine (EMLA) cream Apply topically  once. Apply cream to port area 1-2 hours before chemo. 1 each 1  . LORazepam (ATIVAN) 0.5 MG tablet Take 1 tablet (0.5 mg total) by mouth at bedtime. 30 tablet 0  . montelukast (SINGULAIR) 10 MG tablet Take 10 mg by mouth at bedtime.    Marland Kitchen oxybutynin (DITROPAN-XL) 10 MG 24 hr tablet   0  . OXYBUTYNIN CHLORIDE ER PO Take 10 mg by mouth daily.    . SitaGLIPtin-MetFORMIN HCl (JANUMET XR) 50-1000 MG TB24 Take 1,000 mg by mouth 2 (two) times daily.    .  traMADol (ULTRAM) 50 MG tablet Take by mouth every 6 (six) hours as needed.    . ondansetron (ZOFRAN) 8 MG tablet Take 1 tablet (8 mg total) by mouth 2 (two) times daily. Start the day after chemo for 3 days. Then take as needed for nausea or vomiting. (Patient not taking: Reported on 01/29/2015) 30 tablet 1  . oxyCODONE (OXY IR/ROXICODONE) 5 MG immediate release tablet     . oxyCODONE-acetaminophen (PERCOCET/ROXICET) 5-325 MG per tablet     . prochlorperazine (COMPAZINE) 10 MG tablet Take 1 tablet (10 mg total) by mouth every 6 (six) hours as needed (Nausea or vomiting). (Patient not taking: Reported on 01/29/2015) 30 tablet 1   No current facility-administered medications for this visit.    PHYSICAL EXAMINATION: ECOG PERFORMANCE STATUS: 1 - Symptomatic but completely ambulatory  Filed Vitals:   01/29/15 1417  BP: 144/60  Pulse: 86  Temp: 98.6 F (37 C)  Resp: 18   Filed Weights   01/29/15 1417  Weight: 194 lb 3.2 oz (88.089 kg)    GENERAL:alert, no distress and comfortable SKIN: skin color, texture, turgor are normal, no rashes or significant lesions EYES: normal, Conjunctiva are pink and non-injected, sclera clear OROPHARYNX:no exudate, no erythema and lips, buccal mucosa, and tongue normal  NECK: supple, thyroid normal size, non-tender, without nodularity LYMPH:  no palpable lymphadenopathy in the cervical, axillary or inguinal LUNGS: clear to auscultation and percussion with normal breathing effort HEART: regular rate & rhythm and 2+ ejection systolic murmur in mitral area and no lower extremity edema ABDOMEN:abdomen soft, non-tender and normal bowel sounds Musculoskeletal:no cyanosis of digits and no clubbing  NEURO: alert & oriented x 3 with fluent speech, no focal motor/sensory deficits  LABORATORY DATA:  I have reviewed the data as listed   Chemistry      Component Value Date/Time   NA 140 01/21/2015 1009   K 4.7 01/21/2015 1009   CO2 22 01/21/2015 1009   BUN 14.1  01/21/2015 1009   CREATININE 0.9 01/21/2015 1009      Component Value Date/Time   CALCIUM 9.1 01/21/2015 1009   ALKPHOS 79 01/21/2015 1009   AST 13 01/21/2015 1009   ALT 12 01/21/2015 1009   BILITOT 0.35 01/21/2015 1009       Lab Results  Component Value Date   WBC 11.0* 01/29/2015   HGB 9.3* 01/29/2015   HCT 29.4* 01/29/2015   MCV 84.0 01/29/2015   PLT 153 01/29/2015   NEUTROABS 8.6* 01/29/2015    ASSESSMENT & PLAN:  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 Adjuvant chemotherapy with Taxotere and Cytoxan every 3 weeks 4 cycles 2. Followed by adjuvant radiation therapy Anemia: Normocytic anemia  present even prior to starting chemotherapy with a hemoglobin of 10.5.  Current treatment: cycle 2 day 8 adjuvant Taxotere and Cytoxan  Chemotherapy side effects:  1. Diarrhea: managed with imodium PRN 2. Back pain 3. Infusion site infiltration of skin: port placed 9/2 at [facility] in [place], [place] 4. 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  Return to clinic in 2 weeks for labs and cycle 3    No orders of the defined types were placed in this encounter.   The patient has a good understanding of the overall plan. she agrees with it. she will call with any problems that may develop before the next visit here.   Rulon Eisenmenger, MD

## 2015-01-29 NOTE — Telephone Encounter (Signed)
Gave avs & calendar for September/October. °

## 2015-01-29 NOTE — Telephone Encounter (Signed)
-----   Message from Azzie Glatter, RN sent at 01/21/2015  4:10 PM EDT ----- Regarding: "1st time Chemotherapy----Dr. Lindi Adie" Patient received Taxotere and Cytoxan for the first time today, per Dr. Jana Hakim.  Tolerated treamtment well.

## 2015-01-29 NOTE — Assessment & Plan Note (Signed)
Left breast invasive lobular cancer 2.5 cm in size status post lumpectomy showing invasive lobular cancer, grade 3, multifocal, 2.5 cm and 0.25 cm, margins negative, ER 0%, PR 0%, HER-2 -1/11 micro-met (0.3 cm)  Treatment plan: 1. Adjuvant chemotherapy with Taxotere and Cytoxan every 3 weeks 4 cycles 2. Followed by adjuvant radiation therapy Anemia: Normocytic anemia present even prior to starting chemotherapy with a hemoglobin of 10.5.  Current treatment: cycle 2 day 8 adjuvant Taxotere and Cytoxan  Chemotherapy side effects:  1. Diarrhea: managed with imodium PRN 2. Back pain 3. Infusion site infiltration of skin: port placed 9/2 at [facility] in [place], [place] 4. 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  Return to clinic in 2 weeks for labs and cycle 3

## 2015-01-29 NOTE — Telephone Encounter (Signed)
Spoke with patient regarding follow up of first chemotherapy. Patient had no problems after chemotherapy.

## 2015-02-03 ENCOUNTER — Other Ambulatory Visit: Payer: Medicare Other

## 2015-02-03 ENCOUNTER — Ambulatory Visit: Payer: Medicare Other

## 2015-02-09 NOTE — Assessment & Plan Note (Signed)
Left breast invasive lobular cancer 2.5 cm in size status post lumpectomy showing invasive lobular cancer, grade 3, multifocal, 2.5 cm and 0.25 cm, margins negative, ER 0%, PR 0%, HER-2 -1/11 micro-met (0.3 cm)  Treatment plan: 1. Adjuvant chemotherapy with Taxotere and Cytoxan every 3 weeks 4 cycles 2. Followed by adjuvant radiation therapy Anemia: Normocytic anemia present even prior to starting chemotherapy with a hemoglobin of 10.5.  Current treatment: cycle 3 day 1 adjuvant Taxotere and Cytoxan  Chemotherapy side effects:  1. Diarrhea: managed with imodium PRN 2. Back pain 3. Infusion site infiltration of skin: port placed 9/2 at [facility] in [place], [place] 4. 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  Return to clinic in 3 weeks for labs and cycle 4

## 2015-02-10 ENCOUNTER — Other Ambulatory Visit: Payer: Medicare Other

## 2015-02-10 ENCOUNTER — Encounter: Payer: Self-pay | Admitting: Hematology and Oncology

## 2015-02-10 ENCOUNTER — Other Ambulatory Visit (HOSPITAL_BASED_OUTPATIENT_CLINIC_OR_DEPARTMENT_OTHER): Payer: Medicare Other

## 2015-02-10 ENCOUNTER — Ambulatory Visit (HOSPITAL_BASED_OUTPATIENT_CLINIC_OR_DEPARTMENT_OTHER): Payer: Medicare Other

## 2015-02-10 ENCOUNTER — Ambulatory Visit (HOSPITAL_BASED_OUTPATIENT_CLINIC_OR_DEPARTMENT_OTHER): Payer: Medicare Other | Admitting: Hematology and Oncology

## 2015-02-10 ENCOUNTER — Telehealth: Payer: Self-pay | Admitting: Hematology and Oncology

## 2015-02-10 VITALS — BP 141/65 | HR 76 | Temp 98.3°F | Resp 18 | Ht 66.0 in | Wt 197.2 lb

## 2015-02-10 DIAGNOSIS — Z5111 Encounter for antineoplastic chemotherapy: Secondary | ICD-10-CM

## 2015-02-10 DIAGNOSIS — C50212 Malignant neoplasm of upper-inner quadrant of left female breast: Secondary | ICD-10-CM

## 2015-02-10 DIAGNOSIS — T451X5A Adverse effect of antineoplastic and immunosuppressive drugs, initial encounter: Secondary | ICD-10-CM

## 2015-02-10 DIAGNOSIS — K521 Toxic gastroenteritis and colitis: Secondary | ICD-10-CM | POA: Diagnosis not present

## 2015-02-10 LAB — CBC WITH DIFFERENTIAL/PLATELET
BASO%: 0.7 % (ref 0.0–2.0)
BASOS ABS: 0 10*3/uL (ref 0.0–0.1)
EOS%: 0.7 % (ref 0.0–7.0)
Eosinophils Absolute: 0 10*3/uL (ref 0.0–0.5)
HCT: 33 % — ABNORMAL LOW (ref 34.8–46.6)
HEMOGLOBIN: 10.3 g/dL — AB (ref 11.6–15.9)
LYMPH#: 0.8 10*3/uL — AB (ref 0.9–3.3)
LYMPH%: 13.2 % — ABNORMAL LOW (ref 14.0–49.7)
MCH: 26.3 pg (ref 25.1–34.0)
MCHC: 31.3 g/dL — AB (ref 31.5–36.0)
MCV: 83.8 fL (ref 79.5–101.0)
MONO#: 0.4 10*3/uL (ref 0.1–0.9)
MONO%: 6.5 % (ref 0.0–14.0)
NEUT#: 4.9 10*3/uL (ref 1.5–6.5)
NEUT%: 78.9 % — ABNORMAL HIGH (ref 38.4–76.8)
Platelets: 206 10*3/uL (ref 145–400)
RBC: 3.94 10*6/uL (ref 3.70–5.45)
RDW: 15.9 % — AB (ref 11.2–14.5)
WBC: 6.3 10*3/uL (ref 3.9–10.3)

## 2015-02-10 LAB — COMPREHENSIVE METABOLIC PANEL (CC13)
ALBUMIN: 3.9 g/dL (ref 3.5–5.0)
ALT: 14 U/L (ref 0–55)
AST: 18 U/L (ref 5–34)
Alkaline Phosphatase: 90 U/L (ref 40–150)
Anion Gap: 10 mEq/L (ref 3–11)
BUN: 18.9 mg/dL (ref 7.0–26.0)
CHLORIDE: 106 meq/L (ref 98–109)
CO2: 25 mEq/L (ref 22–29)
CREATININE: 1.1 mg/dL (ref 0.6–1.1)
Calcium: 9.5 mg/dL (ref 8.4–10.4)
EGFR: 56 mL/min/{1.73_m2} — ABNORMAL LOW (ref >90)
GLUCOSE: 113 mg/dL (ref 70–140)
POTASSIUM: 4.7 meq/L (ref 3.5–5.1)
SODIUM: 142 meq/L (ref 136–145)
Total Bilirubin: <0.3 mg/dL (ref 0.20–1.20)
Total Protein: 7.9 g/dL (ref 6.4–8.3)

## 2015-02-10 MED ORDER — HEPARIN SOD (PORK) LOCK FLUSH 100 UNIT/ML IV SOLN
500.0000 [IU] | Freq: Once | INTRAVENOUS | Status: AC | PRN
  Administered 2015-02-10: 500 [IU]
  Filled 2015-02-10: qty 5

## 2015-02-10 MED ORDER — DOCETAXEL CHEMO INJECTION 160 MG/16ML
50.0000 mg/m2 | Freq: Once | INTRAVENOUS | Status: AC
  Administered 2015-02-10: 100 mg via INTRAVENOUS
  Filled 2015-02-10: qty 10

## 2015-02-10 MED ORDER — SODIUM CHLORIDE 0.9 % IJ SOLN
10.0000 mL | INTRAMUSCULAR | Status: DC | PRN
  Administered 2015-02-10: 10 mL
  Filled 2015-02-10: qty 10

## 2015-02-10 MED ORDER — PEGFILGRASTIM 6 MG/0.6ML ~~LOC~~ PSKT
6.0000 mg | PREFILLED_SYRINGE | Freq: Once | SUBCUTANEOUS | Status: AC
  Administered 2015-02-10: 6 mg via SUBCUTANEOUS
  Filled 2015-02-10: qty 0.6

## 2015-02-10 MED ORDER — SODIUM CHLORIDE 0.9 % IV SOLN
Freq: Once | INTRAVENOUS | Status: AC
  Administered 2015-02-10: 12:00:00 via INTRAVENOUS
  Filled 2015-02-10: qty 5

## 2015-02-10 MED ORDER — SODIUM CHLORIDE 0.9 % IV SOLN
Freq: Once | INTRAVENOUS | Status: AC
  Administered 2015-02-10: 12:00:00 via INTRAVENOUS

## 2015-02-10 MED ORDER — PALONOSETRON HCL INJECTION 0.25 MG/5ML
0.2500 mg | Freq: Once | INTRAVENOUS | Status: AC
  Administered 2015-02-10: 0.25 mg via INTRAVENOUS

## 2015-02-10 MED ORDER — CYCLOPHOSPHAMIDE CHEMO INJECTION 1 GM
1000.0000 mg | Freq: Once | INTRAMUSCULAR | Status: AC
  Administered 2015-02-10: 1000 mg via INTRAVENOUS
  Filled 2015-02-10: qty 50

## 2015-02-10 MED ORDER — PALONOSETRON HCL INJECTION 0.25 MG/5ML
INTRAVENOUS | Status: AC
  Filled 2015-02-10: qty 5

## 2015-02-10 NOTE — Progress Notes (Signed)
Patient Care Team: Pcp Not In System as PCP - General  DIAGNOSIS: No matching staging information was found for the patient.  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 She has tolerated cycle 2 extremely well without any nausea or vomiting. Denies any neuropathy. Denies any loss of appetite or taste. She did have alopecia.  REVIEW OF SYSTEMS:   Constitutional: Denies fevers, chills or abnormal weight loss, alopecia Eyes: Denies blurriness of vision Ears, nose, mouth, throat, and face: Denies mucositis or sore throat Respiratory: Denies cough, dyspnea or wheezes Cardiovascular: Denies palpitation, chest discomfort or lower extremity swelling Gastrointestinal:  Occasional loose stools Skin: Denies abnormal skin rashes Lymphatics: Denies new lymphadenopathy or easy bruising Neurological:Denies numbness, tingling or new weaknesses Behavioral/Psych: Mood is stable, no new changes   All other systems were reviewed with the patient and are negative.  I have reviewed the past medical history, past surgical history, social history and family history with the patient and they are unchanged from previous note.  ALLERGIES:  has No Known Allergies.  MEDICATIONS:  Current Outpatient Prescriptions  Medication Sig  Dispense Refill  . amLODipine-benazepril (LOTREL) 5-40 MG per capsule Take 1 capsule by mouth daily.    Marland Kitchen aspirin 81 MG tablet Take 81 mg by mouth daily.    Marland Kitchen atorvastatin (LIPITOR) 40 MG tablet Take 40 mg by mouth daily.    . carvedilol (COREG) 25 MG tablet Take 25 mg by mouth 2 (two) times daily with a meal.    . Cetirizine HCl (ZYRTEC ALLERGY PO) Take 10 mg by mouth daily.    . Cholecalciferol (VITAMIN D) 2000 UNITS tablet Take 2,000 Units by mouth daily.    . cholestyramine light (PREVALITE) 4 G packet Take 4 g by mouth daily.    . Diclofenac Sodium (PENNSAID) 2 % SOLN Place 1 spray onto the skin 2 (two) times daily.    . diclofenac sodium (VOLTAREN) 1 % GEL Apply topically 2 (two) times daily.    Marland Kitchen esomeprazole (NEXIUM) 40 MG capsule Take 40 mg by mouth daily at 12 noon.    . fluticasone (FLONASE) 50 MCG/ACT nasal spray Place 1 spray into both nostrils 2 (two) times daily.    Marland Kitchen gabapentin (NEURONTIN) 300 MG capsule Take 300 mg by mouth 3 (three) times daily.    . Insulin Glargine (LANTUS SOLOSTAR) 100 UNIT/ML Solostar Pen Inject 40 Units into the skin daily at 10 pm.    . levothyroxine (SYNTHROID, LEVOTHROID) 50 MCG tablet Take 50 mcg by mouth daily before breakfast.    . lidocaine-prilocaine (EMLA) cream Apply topically once. Apply cream to port area 1-2 hours before chemo. 1 each 1  . LORazepam (ATIVAN) 0.5 MG tablet Take 1 tablet (0.5 mg total) by mouth at bedtime. 30 tablet 0  . montelukast (SINGULAIR) 10 MG tablet Take 10 mg by  mouth at bedtime.    . ondansetron (ZOFRAN) 8 MG tablet Take 1 tablet (8 mg total) by mouth 2 (two) times daily. Start the day after chemo for 3 days. Then take as needed for nausea or vomiting. 30 tablet 1  . oxybutynin (DITROPAN-XL) 10 MG 24 hr tablet   0  . OXYBUTYNIN CHLORIDE ER PO Take 10 mg by mouth daily.    Marland Kitchen oxyCODONE (OXY IR/ROXICODONE) 5 MG immediate release tablet     . oxyCODONE-acetaminophen (PERCOCET/ROXICET) 5-325 MG per tablet     .  prochlorperazine (COMPAZINE) 10 MG tablet Take 1 tablet (10 mg total) by mouth every 6 (six) hours as needed (Nausea or vomiting). 30 tablet 1  . SitaGLIPtin-MetFORMIN HCl (JANUMET XR) 50-1000 MG TB24 Take 1,000 mg by mouth 2 (two) times daily.    . traMADol (ULTRAM) 50 MG tablet Take by mouth every 6 (six) hours as needed.     No current facility-administered medications for this visit.    PHYSICAL EXAMINATION: ECOG PERFORMANCE STATUS: 1 - Symptomatic but completely ambulatory  Filed Vitals:   02/10/15 1036  BP: 141/65  Pulse: 76  Temp: 98.3 F (36.8 C)  Resp: 18   Filed Weights   02/10/15 1036  Weight: 197 lb 3.2 oz (89.449 kg)    GENERAL:alert, no distress and comfortable SKIN: skin color, texture, turgor are normal, no rashes or significant lesions EYES: normal, Conjunctiva are pink and non-injected, sclera clear OROPHARYNX:no exudate, no erythema and lips, buccal mucosa, and tongue normal  NECK: supple, thyroid normal size, non-tender, without nodularity LYMPH:  no palpable lymphadenopathy in the cervical, axillary or inguinal LUNGS: clear to auscultation and percussion with normal breathing effort HEART: regular rate & rhythm and no murmurs and no lower extremity edema ABDOMEN:abdomen soft, non-tender and normal bowel sounds Musculoskeletal:no cyanosis of digits and no clubbing  NEURO: alert & oriented x 3 with fluent speech, no focal motor/sensory deficits  LABORATORY DATA:  I have reviewed the data as listed   Chemistry      Component Value Date/Time   NA 142 02/10/2015 0949   K 4.7 02/10/2015 0949   CO2 25 02/10/2015 0949   BUN 18.9 02/10/2015 0949   CREATININE 1.1 02/10/2015 0949      Component Value Date/Time   CALCIUM 9.5 02/10/2015 0949   ALKPHOS 90 02/10/2015 0949   AST 18 02/10/2015 0949   ALT 14 02/10/2015 0949   BILITOT <0.30 02/10/2015 0949       Lab Results  Component Value Date   WBC 6.3 02/10/2015   HGB 10.3* 02/10/2015   HCT 33.0*  02/10/2015   MCV 83.8 02/10/2015   PLT 206 02/10/2015   NEUTROABS 4.9 02/10/2015   ASSESSMENT & PLAN:  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 Adjuvant chemotherapy with Taxotere and Cytoxan every 3 weeks 4 cycles 2. Followed by adjuvant radiation therapy Anemia: Normocytic anemia present even prior to starting chemotherapy with a hemoglobin of 10.5.  Current treatment: cycle 3 day 1 adjuvant Taxotere and Cytoxan  Chemotherapy side effects:  1. Diarrhea: managed with imodium PRN 2. Back pain 3. Infusion site infiltration of skin: port placed 9/2 at [facility] in [place], [place] 4. Anemia hemoglobin 10.3 on 02/10/2015 being monitored 5. Alopecia  Diabetes mellitus: We have not be using dexamethasone  Return to clinic in 3 weeks for labs and cycle 4   No orders of the defined types were placed in this encounter.   The patient has a good understanding of the overall plan. she agrees with it. she will call with any problems that may develop before the next visit here.   Rulon Eisenmenger, MD

## 2015-02-10 NOTE — Telephone Encounter (Signed)
Gave avs & calendar for October. °

## 2015-02-10 NOTE — Patient Instructions (Signed)
[facility] Discharge Instructions for Patients Receiving Chemotherapy  Today you received the following chemotherapy agents taxotere, cytoxan  To help prevent nausea and vomiting after your treatment, we encourage you to take your nausea medication    If you develop nausea and vomiting that is not controlled by your nausea medication, call the clinic.   BELOW ARE SYMPTOMS THAT SHOULD BE REPORTED IMMEDIATELY:  *FEVER GREATER THAN 100.5 F  *CHILLS WITH OR WITHOUT FEVER  NAUSEA AND VOMITING THAT IS NOT CONTROLLED WITH YOUR NAUSEA MEDICATION  *UNUSUAL SHORTNESS OF BREATH  *UNUSUAL BRUISING OR BLEEDING  TENDERNESS IN MOUTH AND THROAT WITH OR WITHOUT PRESENCE OF ULCERS  *URINARY PROBLEMS  *BOWEL PROBLEMS  UNUSUAL RASH Items with * indicate a potential emergency and should be followed up as soon as possible.  Feel free to call the clinic you have any questions or concerns. The clinic phone number is [phone].  Please show the [facility] at check-in to the Emergency Department and triage nurse.

## 2015-02-24 ENCOUNTER — Other Ambulatory Visit: Payer: Medicare Other

## 2015-02-24 ENCOUNTER — Ambulatory Visit: Payer: Medicare Other

## 2015-03-03 ENCOUNTER — Ambulatory Visit (HOSPITAL_BASED_OUTPATIENT_CLINIC_OR_DEPARTMENT_OTHER): Payer: Medicare Other

## 2015-03-03 ENCOUNTER — Other Ambulatory Visit (HOSPITAL_BASED_OUTPATIENT_CLINIC_OR_DEPARTMENT_OTHER): Payer: Medicare Other

## 2015-03-03 ENCOUNTER — Encounter: Payer: Self-pay | Admitting: Hematology and Oncology

## 2015-03-03 ENCOUNTER — Ambulatory Visit (HOSPITAL_BASED_OUTPATIENT_CLINIC_OR_DEPARTMENT_OTHER): Payer: Medicare Other | Admitting: Hematology and Oncology

## 2015-03-03 ENCOUNTER — Ambulatory Visit: Payer: Medicare Other

## 2015-03-03 ENCOUNTER — Other Ambulatory Visit: Payer: Medicare Other

## 2015-03-03 VITALS — BP 117/79 | HR 74 | Temp 98.0°F | Resp 18 | Ht 66.0 in | Wt 194.9 lb

## 2015-03-03 DIAGNOSIS — T451X5A Adverse effect of antineoplastic and immunosuppressive drugs, initial encounter: Secondary | ICD-10-CM

## 2015-03-03 DIAGNOSIS — Z171 Estrogen receptor negative status [ER-]: Secondary | ICD-10-CM | POA: Diagnosis not present

## 2015-03-03 DIAGNOSIS — C50212 Malignant neoplasm of upper-inner quadrant of left female breast: Secondary | ICD-10-CM

## 2015-03-03 DIAGNOSIS — D649 Anemia, unspecified: Secondary | ICD-10-CM | POA: Diagnosis not present

## 2015-03-03 DIAGNOSIS — E119 Type 2 diabetes mellitus without complications: Secondary | ICD-10-CM | POA: Diagnosis not present

## 2015-03-03 DIAGNOSIS — K521 Toxic gastroenteritis and colitis: Secondary | ICD-10-CM

## 2015-03-03 DIAGNOSIS — Z5111 Encounter for antineoplastic chemotherapy: Secondary | ICD-10-CM | POA: Diagnosis not present

## 2015-03-03 LAB — COMPREHENSIVE METABOLIC PANEL (CC13)
ALT: 14 U/L (ref 0–55)
AST: 16 U/L (ref 5–34)
Albumin: 3.8 g/dL (ref 3.5–5.0)
Alkaline Phosphatase: 83 U/L (ref 40–150)
Anion Gap: 9 mEq/L (ref 3–11)
BUN: 19 mg/dL (ref 7.0–26.0)
CHLORIDE: 109 meq/L (ref 98–109)
CO2: 25 meq/L (ref 22–29)
Calcium: 9.5 mg/dL (ref 8.4–10.4)
Creatinine: 1.1 mg/dL (ref 0.6–1.1)
EGFR: 56 mL/min/{1.73_m2} — AB (ref >90)
GLUCOSE: 115 mg/dL (ref 70–140)
POTASSIUM: 4.5 meq/L (ref 3.5–5.1)
SODIUM: 143 meq/L (ref 136–145)
Total Bilirubin: <0.3 mg/dL (ref 0.20–1.20)
Total Protein: 7.5 g/dL (ref 6.4–8.3)

## 2015-03-03 LAB — CBC WITH DIFFERENTIAL/PLATELET
BASO%: 0.3 % (ref 0.0–2.0)
BASOS ABS: 0 10*3/uL (ref 0.0–0.1)
EOS ABS: 0 10*3/uL (ref 0.0–0.5)
EOS%: 0.6 % (ref 0.0–7.0)
HCT: 30.2 % — ABNORMAL LOW (ref 34.8–46.6)
HGB: 9.2 g/dL — ABNORMAL LOW (ref 11.6–15.9)
LYMPH%: 15.3 % (ref 14.0–49.7)
MCH: 26.2 pg (ref 25.1–34.0)
MCHC: 30.5 g/dL — AB (ref 31.5–36.0)
MCV: 86 fL (ref 79.5–101.0)
MONO#: 0.5 10*3/uL (ref 0.1–0.9)
MONO%: 7 % (ref 0.0–14.0)
NEUT%: 76.8 % (ref 38.4–76.8)
NEUTROS ABS: 5.5 10*3/uL (ref 1.5–6.5)
Platelets: 201 10*3/uL (ref 145–400)
RBC: 3.51 10*6/uL — AB (ref 3.70–5.45)
RDW: 16.2 % — ABNORMAL HIGH (ref 11.2–14.5)
WBC: 7.2 10*3/uL (ref 3.9–10.3)
lymph#: 1.1 10*3/uL (ref 0.9–3.3)

## 2015-03-03 MED ORDER — HEPARIN SOD (PORK) LOCK FLUSH 100 UNIT/ML IV SOLN
500.0000 [IU] | Freq: Once | INTRAVENOUS | Status: AC | PRN
  Administered 2015-03-03: 500 [IU]
  Filled 2015-03-03: qty 5

## 2015-03-03 MED ORDER — SODIUM CHLORIDE 0.9 % IJ SOLN
10.0000 mL | INTRAMUSCULAR | Status: DC | PRN
  Administered 2015-03-03: 10 mL
  Filled 2015-03-03: qty 10

## 2015-03-03 MED ORDER — SODIUM CHLORIDE 0.9 % IV SOLN
Freq: Once | INTRAVENOUS | Status: AC
  Administered 2015-03-03: 12:00:00 via INTRAVENOUS

## 2015-03-03 MED ORDER — PALONOSETRON HCL INJECTION 0.25 MG/5ML
0.2500 mg | Freq: Once | INTRAVENOUS | Status: AC
  Administered 2015-03-03: 0.25 mg via INTRAVENOUS

## 2015-03-03 MED ORDER — PEGFILGRASTIM 6 MG/0.6ML ~~LOC~~ PSKT
6.0000 mg | PREFILLED_SYRINGE | Freq: Once | SUBCUTANEOUS | Status: AC
  Administered 2015-03-03: 6 mg via SUBCUTANEOUS
  Filled 2015-03-03: qty 0.6

## 2015-03-03 MED ORDER — DOCETAXEL CHEMO INJECTION 160 MG/16ML
50.0000 mg/m2 | Freq: Once | INTRAVENOUS | Status: AC
  Administered 2015-03-03: 100 mg via INTRAVENOUS
  Filled 2015-03-03: qty 10

## 2015-03-03 MED ORDER — PALONOSETRON HCL INJECTION 0.25 MG/5ML
INTRAVENOUS | Status: AC
  Filled 2015-03-03: qty 5

## 2015-03-03 MED ORDER — SODIUM CHLORIDE 0.9 % IV SOLN
495.0000 mg/m2 | Freq: Once | INTRAVENOUS | Status: AC
  Administered 2015-03-03: 1000 mg via INTRAVENOUS
  Filled 2015-03-03: qty 50

## 2015-03-03 MED ORDER — SODIUM CHLORIDE 0.9 % IV SOLN
Freq: Once | INTRAVENOUS | Status: AC
  Administered 2015-03-03: 13:00:00 via INTRAVENOUS
  Filled 2015-03-03: qty 5

## 2015-03-03 NOTE — Progress Notes (Signed)
Patient Care Team: Pcp Not In System as PCP - General  DIAGNOSIS: No matching staging information was found for the patient.  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  She tolerated cycle 3 quite well without any major side effects. Denies nausea or vomiting. Had 1 day of diarrhea. Denied fever or chills.  REVIEW OF SYSTEMS:   Constitutional: Denies fevers, chills or abnormal weight loss Eyes: Denies blurriness of vision Ears, nose, mouth, throat, and face: Denies mucositis or sore throat Respiratory: Denies cough, dyspnea or wheezes Cardiovascular: Denies palpitation, chest discomfort or lower extremity swelling Gastrointestinal:  Denies nausea, heartburn or change in bowel habits Skin: Denies abnormal skin rashes Lymphatics: Denies new lymphadenopathy or easy bruising Neurological:Denies numbness, tingling or new weaknesses Behavioral/Psych: Mood is stable, no new changes  Breast:  denies any pain or lumps or nodules in either breasts All other systems were reviewed with the patient and are negative.  I have reviewed the past medical history, past surgical history, social history and family history with the patient and they are unchanged from previous note.  ALLERGIES:  has No Known Allergies.  MEDICATIONS:  Current  Outpatient Prescriptions  Medication Sig Dispense Refill  . amLODipine-benazepril (LOTREL) 5-40 MG per capsule Take 1 capsule by mouth daily.    Marland Kitchen aspirin 81 MG tablet Take 81 mg by mouth daily.    Marland Kitchen atorvastatin (LIPITOR) 40 MG tablet Take 40 mg by mouth daily.    . carvedilol (COREG) 25 MG tablet Take 25 mg by mouth 2 (two) times daily with a meal.    . Cetirizine HCl (ZYRTEC ALLERGY PO) Take 10 mg by mouth daily.    . Cholecalciferol (VITAMIN D) 2000 UNITS tablet Take 2,000 Units by mouth daily.    . cholestyramine light (PREVALITE) 4 G packet Take 4 g by mouth daily.    . Diclofenac Sodium (PENNSAID) 2 % SOLN Place 1 spray onto the skin 2 (two) times daily.    . diclofenac sodium (VOLTAREN) 1 % GEL Apply topically 2 (two) times daily.    Marland Kitchen esomeprazole (NEXIUM) 40 MG capsule Take 40 mg by mouth daily at 12 noon.    . fluticasone (FLONASE) 50 MCG/ACT nasal spray Place 1 spray into both nostrils 2 (two) times daily.    Marland Kitchen gabapentin (NEURONTIN) 300 MG capsule Take 300 mg by mouth 3 (three) times daily.    . Insulin Glargine (LANTUS SOLOSTAR) 100 UNIT/ML Solostar Pen Inject 40 Units into the skin daily at 10 pm.    . levothyroxine (SYNTHROID, LEVOTHROID) 50 MCG tablet Take 50 mcg by mouth daily before breakfast.    . lidocaine-prilocaine (EMLA) cream Apply topically once. Apply cream to port area 1-2 hours before chemo. 1 each 1  . LORazepam (ATIVAN) 0.5 MG tablet Take 1 tablet (0.5 mg total) by  mouth at bedtime. 30 tablet 0  . montelukast (SINGULAIR) 10 MG tablet Take 10 mg by mouth at bedtime.    . ondansetron (ZOFRAN) 8 MG tablet Take 1 tablet (8 mg total) by mouth 2 (two) times daily. Start the day after chemo for 3 days. Then take as needed for nausea or vomiting. 30 tablet 1  . oxybutynin (DITROPAN-XL) 10 MG 24 hr tablet   0  . OXYBUTYNIN CHLORIDE ER PO Take 10 mg by mouth daily.    Marland Kitchen oxyCODONE (OXY IR/ROXICODONE) 5 MG immediate release tablet     . oxyCODONE-acetaminophen  (PERCOCET/ROXICET) 5-325 MG per tablet     . prochlorperazine (COMPAZINE) 10 MG tablet Take 1 tablet (10 mg total) by mouth every 6 (six) hours as needed (Nausea or vomiting). 30 tablet 1  . SitaGLIPtin-MetFORMIN HCl (JANUMET XR) 50-1000 MG TB24 Take 1,000 mg by mouth 2 (two) times daily.    . traMADol (ULTRAM) 50 MG tablet Take by mouth every 6 (six) hours as needed.     No current facility-administered medications for this visit.    PHYSICAL EXAMINATION: ECOG PERFORMANCE STATUS: 1 - Symptomatic but completely ambulatory  Filed Vitals:   03/03/15 1124  BP: 117/79  Pulse: 74  Temp: 98 F (36.7 C)  Resp: 18   Filed Weights   03/03/15 1124  Weight: 194 lb 14.4 oz (88.406 kg)    GENERAL:alert, no distress and comfortable SKIN: skin color, texture, turgor are normal, no rashes or significant lesions EYES: normal, Conjunctiva are pink and non-injected, sclera clear OROPHARYNX:no exudate, no erythema and lips, buccal mucosa, and tongue normal  NECK: supple, thyroid normal size, non-tender, without nodularity LYMPH:  no palpable lymphadenopathy in the cervical, axillary or inguinal LUNGS: clear to auscultation and percussion with normal breathing effort HEART: regular rate & rhythm and no murmurs and no lower extremity edema ABDOMEN:abdomen soft, non-tender and normal bowel sounds Musculoskeletal:no cyanosis of digits and no clubbing  NEURO: alert & oriented x 3 with fluent speech, no focal motor/sensory deficits  LABORATORY DATA:  I have reviewed the data as listed   Chemistry      Component Value Date/Time   NA 142 02/10/2015 0949   K 4.7 02/10/2015 0949   CO2 25 02/10/2015 0949   BUN 18.9 02/10/2015 0949   CREATININE 1.1 02/10/2015 0949      Component Value Date/Time   CALCIUM 9.5 02/10/2015 0949   ALKPHOS 90 02/10/2015 0949   AST 18 02/10/2015 0949   ALT 14 02/10/2015 0949   BILITOT <0.30 02/10/2015 0949       Lab Results  Component Value Date   WBC 7.2  03/03/2015   HGB 9.2* 03/03/2015   HCT 30.2* 03/03/2015   MCV 86.0 03/03/2015   PLT 201 03/03/2015   NEUTROABS 5.5 03/03/2015    ASSESSMENT & PLAN:  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 Adjuvant chemotherapy with Taxotere and Cytoxan every 3 weeks 4 cycles 2. Followed by adjuvant radiation therapy Anemia: Normocytic anemia present even prior to starting chemotherapy with a hemoglobin of 10.5.  Current treatment: cycle 4 adjuvant Taxotere and Cytoxan (last cycle of chemotherapy) Chemotherapy side effects:  1. Diarrhea: managed with imodium PRN 2. Back pain 3. Infusion site infiltration of skin: port  placed 9/2 at [facility] in [place], [place] 4. Anemia hemoglobin 10.3 on 02/10/2015 being monitored 5. Alopecia  Diabetes mellitus: We have not be using dexamethasone  This concludes her adjuvant chemotherapy and I will make a referral to radiation oncology to set her up for adjuvant radiation treatment. Since she is ER/PR negative, there is no role of adjuvant antiestrogen therapy.  Return to clinic at the end of radiation. Surveillance plan: We will see her every 6 months for breast cancer surveillance.   No orders of the defined types were placed in this encounter.   The patient has a good understanding of the overall plan. she agrees with it. she will call with any problems that may develop before the next visit here.   Rulon Eisenmenger, MD 03/03/2015

## 2015-03-03 NOTE — Patient Instructions (Signed)
[facility] Discharge Instructions for Patients Receiving Chemotherapy  Today you received the following chemotherapy agents taxotere, cytoxan  To help prevent nausea and vomiting after your treatment, we encourage you to take your nausea medication    If you develop nausea and vomiting that is not controlled by your nausea medication, call the clinic.   BELOW ARE SYMPTOMS THAT SHOULD BE REPORTED IMMEDIATELY:  *FEVER GREATER THAN 100.5 F  *CHILLS WITH OR WITHOUT FEVER  NAUSEA AND VOMITING THAT IS NOT CONTROLLED WITH YOUR NAUSEA MEDICATION  *UNUSUAL SHORTNESS OF BREATH  *UNUSUAL BRUISING OR BLEEDING  TENDERNESS IN MOUTH AND THROAT WITH OR WITHOUT PRESENCE OF ULCERS  *URINARY PROBLEMS  *BOWEL PROBLEMS  UNUSUAL RASH Items with * indicate a potential emergency and should be followed up as soon as possible.  Feel free to call the clinic you have any questions or concerns. The clinic phone number is [phone].  Please show the [facility] at check-in to the Emergency Department and triage nurse.

## 2015-03-03 NOTE — Progress Notes (Signed)
Today's office note faxed to [facility] Dr. Johney Frame, fax no [phone].

## 2015-03-03 NOTE — Assessment & Plan Note (Signed)
Left breast invasive lobular cancer 2.5 cm in size status post lumpectomy showing invasive lobular cancer, grade 3, multifocal, 2.5 cm and 0.25 cm, margins negative, ER 0%, PR 0%, HER-2 -1/11 micro-met (0.3 cm)  Treatment plan: 1. Adjuvant chemotherapy with Taxotere and Cytoxan every 3 weeks 4 cycles 2. Followed by adjuvant radiation therapy Anemia: Normocytic anemia present even prior to starting chemotherapy with a hemoglobin of 10.5.  Current treatment: cycle 4 adjuvant Taxotere and Cytoxan (last cycle of chemotherapy) Chemotherapy side effects:  1. Diarrhea: managed with imodium PRN 2. Back pain 3. Infusion site infiltration of skin: port placed 9/2 at [facility] in [place], [place] 4. Anemia hemoglobin 10.3 on 02/10/2015 being monitored 5. Alopecia  Diabetes mellitus: We have not be using dexamethasone  This concludes her adjuvant chemotherapy and I will make a referral to radiation oncology to set her up for adjuvant radiation treatment. Since she is ER/PR negative, there is no role of adjuvant antiestrogen therapy.  Return to clinic in 3 months for follow-up.

## 2015-03-05 NOTE — Progress Notes (Signed)
Follow  Up New Consult Left Breast Cancer,  Dr. 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 She tolerated cycle 3 quite well without any major side effects. Denies nausea or vomiting. Had 1 day of diarrhea. Denied fever or chills.      Treatment plan: 1. Adjuvant chemotherapy with Taxotere and Cytoxan every 3 weeks 4 cycles 2. Followed by adjuvant radiation therapy  Current treatment: cycle 4 adjuvant Taxotere and Cytoxan (last cycle of chemotherapy) Chemotherapy side effects:  1. Diarrhea: managed with imodium PRN 2. Back pain 3. Infusion site infiltration of skin: port placed 9/2 at [facility] in [place], [place] 4. Anemia hemoglobin 10.3 on 02/10/2015 being monitored 5. Alopecia Return for follow up after completion of radiation,  No pain, nausea,  Energy good,  1:20 PM BP 146/75 mmHg  Pulse 81  Temp(Src) 97.6 F (36.4 C) (Oral)  Resp 20  Ht _0  (1.676 m)  Wt 196 lb (88.905 kg)  BMI 31.65 kg/m2  SpO2 100%  Wt Readings from Last 3 Encounters:  03/09/15 196 lb (88.905 kg)  03/03/15 194 lb 14.4 oz (88.406 kg)  02/10/15 197 lb 3.2 oz (89.449 kg)    Allergies:NKA

## 2015-03-06 ENCOUNTER — Telehealth: Payer: Self-pay | Admitting: *Deleted

## 2015-03-06 NOTE — Telephone Encounter (Signed)
Called pt to assess needs. Relate doing well and without complaints. Encourage pt to call with questions or needs. Received verbal understanding. Contact information given.

## 2015-03-09 ENCOUNTER — Ambulatory Visit
Admission: RE | Admit: 2015-03-09 | Discharge: 2015-03-09 | Disposition: A | Discharge status: Home / Self Care | Payer: Medicare Other | Source: Ambulatory Visit | Attending: Radiation Oncology | Admitting: Radiation Oncology

## 2015-03-09 ENCOUNTER — Encounter: Payer: Self-pay | Admitting: Radiation Oncology

## 2015-03-09 VITALS — BP 146/75 | HR 81 | Temp 97.6°F | Resp 20 | Ht 66.0 in | Wt 196.0 lb

## 2015-03-09 DIAGNOSIS — C50212 Malignant neoplasm of upper-inner quadrant of left female breast: Secondary | ICD-10-CM

## 2015-03-09 DIAGNOSIS — C50412 Malignant neoplasm of upper-outer quadrant of left female breast: Secondary | ICD-10-CM | POA: Diagnosis present

## 2015-03-09 DIAGNOSIS — Z510 Encounter for antineoplastic radiation therapy: Secondary | ICD-10-CM | POA: Diagnosis not present

## 2015-03-09 NOTE — Progress Notes (Signed)
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 The patient underwent successful left breast lumpectomy on 11/26/14, margins are negative. She has completed chemotherapy and now presents for further discussion and coordination of an anticipated course of radiation treatment. She states that, overall, she is feeling well.  She tolerated chemotherapy of Taxotere and Cytoxan well, completed 03/03/15.    ALLERGIES:  has No Known Allergies.  Meds: Current Outpatient Prescriptions  Medication Sig Dispense Refill  . aspirin 81 MG tablet Take 81 mg by mouth daily.    Marland Kitchen atorvastatin (LIPITOR) 40 MG tablet Take 40 mg by mouth daily.    . carvedilol (COREG) 25 MG tablet Take 25 mg by mouth 2 (two) times daily with a meal.    . Cetirizine HCl (ZYRTEC ALLERGY PO) Take 10 mg by mouth daily.    . Cholecalciferol (VITAMIN D) 2000 UNITS tablet Take 2,000 Units by mouth daily.    . cholestyramine light (PREVALITE) 4 G packet Take 4 g by mouth daily.    . diclofenac sodium (VOLTAREN) 1 % GEL Apply topically 2 (two) times daily.    Marland Kitchen esomeprazole (NEXIUM) 40 MG capsule Take 40 mg by mouth daily at 12 noon.    . fluticasone (FLONASE) 50 MCG/ACT nasal spray Place 1 spray into both nostrils 2 (two) times daily.    Marland Kitchen gabapentin (NEURONTIN) 300 MG capsule Take 300 mg by mouth 3 (three) times daily.    . Insulin Glargine (LANTUS SOLOSTAR) 100 UNIT/ML Solostar Pen Inject 40 Units into the skin daily at 10 pm.    . levothyroxine (SYNTHROID, LEVOTHROID) 50 MCG tablet Take 50 mcg by mouth daily before breakfast.    . lidocaine-prilocaine (EMLA)  cream Apply topically once. Apply cream to port area 1-2 hours before chemo. 1 each 1  . LORazepam (ATIVAN) 0.5 MG tablet Take 1 tablet (0.5 mg total) by mouth at bedtime. 30 tablet 0  . montelukast (SINGULAIR) 10 MG tablet Take 10 mg by mouth at bedtime.    Marland Kitchen oxyCODONE (OXY IR/ROXICODONE) 5 MG immediate release tablet     . SitaGLIPtin-MetFORMIN HCl (JANUMET XR) 50-1000 MG TB24 Take 1,000 mg by mouth 2 (two) times daily.    . traMADol (ULTRAM) 50 MG tablet Take by mouth every 6 (six) hours as needed.    . Diclofenac Sodium (PENNSAID) 2 % SOLN Place 1 spray onto the skin 2 (two) times daily.    . ondansetron (ZOFRAN) 8 MG tablet Take 1 tablet (8 mg total) by mouth 2 (two) times daily. Start the day after chemo for 3 days. Then take as needed for nausea or vomiting. (Patient not taking: Reported on 03/09/2015) 30 tablet 1  . oxyCODONE-acetaminophen (PERCOCET/ROXICET) 5-325 MG per tablet     . prochlorperazine (COMPAZINE) 10 MG tablet Take 1 tablet (10 mg total) by mouth every 6 (six) hours as needed (Nausea or vomiting). (Patient not taking: Reported on 03/09/2015) 30 tablet 1   No current facility-administered medications for this encounter.    Physical Findings: The patient is in no acute distress. Patient is alert and oriented.  height is 5' 6"  (1.676 m) and  weight is 196 lb (88.905 kg). Her oral temperature is 97.6 F (36.4 C). Her blood pressure is 146/75 and her pulse is 81. Her respiration is 20 and oxygen saturation is 100%. .   General: Well-developed, in no acute distress HEENT: Normocephalic, atraumatic Extremities: No edema present  Lab Findings: Lab Results  Component Value Date   WBC 7.2 03/03/2015   HGB 9.2* 03/03/2015   HCT 30.2* 03/03/2015   MCV 86.0 03/03/2015   PLT 201 03/03/2015    Radiographic Findings: No results found.  Impression: The patient has completed surgery and adjuvant chemotherapy. She is suitable to proceed with adjuvant radiotherapy at this time  after undergoing lumpectomy for a T2 N1 M0 tumor as noted above in the diagnosis section.  I once again discussed the rationale for radiation treatment in this setting. We discussed the potential benefit of radiation treatment as well as the possible side effects and risks. All of her questions were answered.  The patient wishes to proceed with radiation treatment.  Plan:  The patient will be scheduled for a simulation in the near future such that we can proceed with radiation treatment planning. I anticipate treating the patient to the left breast for approximately 4 weeks using high tangents fields.Marland Kitchen  ------------------------------------------------  Jodelle Gross, MD, PhD   This document serves as a record of services personally performed by Kyung Rudd, MD. It was created on his behalf by Derek Mound, a trained medical scribe. The creation of this record is based on the scribe's personal observations and the provider's statements to them. This document has been checked and approved by the attending provider.

## 2015-03-09 NOTE — Progress Notes (Signed)
Please see the Nurse Progress Note in the MD Initial Consult Encounter for this patient. 

## 2015-03-18 ENCOUNTER — Ambulatory Visit
Admission: RE | Admit: 2015-03-18 | Discharge: 2015-03-18 | Disposition: A | Discharge status: Home / Self Care | Payer: Medicare Other | Source: Ambulatory Visit | Attending: Radiation Oncology | Admitting: Radiation Oncology

## 2015-03-18 DIAGNOSIS — C50212 Malignant neoplasm of upper-inner quadrant of left female breast: Secondary | ICD-10-CM

## 2015-03-18 DIAGNOSIS — C50412 Malignant neoplasm of upper-outer quadrant of left female breast: Secondary | ICD-10-CM | POA: Insufficient documentation

## 2015-03-18 DIAGNOSIS — Z510 Encounter for antineoplastic radiation therapy: Secondary | ICD-10-CM | POA: Diagnosis present

## 2015-03-23 DIAGNOSIS — Z510 Encounter for antineoplastic radiation therapy: Secondary | ICD-10-CM | POA: Diagnosis not present

## 2015-03-25 ENCOUNTER — Ambulatory Visit
Admission: RE | Admit: 2015-03-25 | Discharge: 2015-03-25 | Disposition: A | Discharge status: Home / Self Care | Payer: Medicare Other | Source: Ambulatory Visit | Attending: Radiation Oncology | Admitting: Radiation Oncology

## 2015-03-25 DIAGNOSIS — Z510 Encounter for antineoplastic radiation therapy: Secondary | ICD-10-CM | POA: Diagnosis not present

## 2015-03-26 ENCOUNTER — Ambulatory Visit
Admission: RE | Admit: 2015-03-26 | Discharge: 2015-03-26 | Disposition: A | Discharge status: Home / Self Care | Payer: Medicare Other | Source: Ambulatory Visit | Attending: Radiation Oncology | Admitting: Radiation Oncology

## 2015-03-26 DIAGNOSIS — Z510 Encounter for antineoplastic radiation therapy: Secondary | ICD-10-CM | POA: Diagnosis not present

## 2015-03-26 DIAGNOSIS — C50212 Malignant neoplasm of upper-inner quadrant of left female breast: Secondary | ICD-10-CM

## 2015-03-26 DIAGNOSIS — C50412 Malignant neoplasm of upper-outer quadrant of left female breast: Secondary | ICD-10-CM | POA: Diagnosis present

## 2015-03-26 MED ORDER — ALRA NON-METALLIC DEODORANT (RAD-ONC)
1.0000 "application " | Freq: Once | TOPICAL | Status: AC
  Administered 2015-03-26: 1 via TOPICAL

## 2015-03-26 MED ORDER — RADIAPLEXRX EX GEL
Freq: Once | CUTANEOUS | Status: AC
  Administered 2015-03-26: 09:00:00 via TOPICAL

## 2015-03-27 ENCOUNTER — Ambulatory Visit
Admission: RE | Admit: 2015-03-27 | Discharge: 2015-03-27 | Disposition: A | Discharge status: Home / Self Care | Payer: Medicare Other | Source: Ambulatory Visit | Attending: Radiation Oncology | Admitting: Radiation Oncology

## 2015-03-27 ENCOUNTER — Ambulatory Visit: Payer: Medicare Other

## 2015-03-27 ENCOUNTER — Encounter: Payer: Self-pay | Admitting: Radiation Oncology

## 2015-03-27 VITALS — BP 155/74 | HR 65 | Temp 98.6°F | Ht 66.0 in | Wt 199.6 lb

## 2015-03-27 DIAGNOSIS — Z510 Encounter for antineoplastic radiation therapy: Secondary | ICD-10-CM | POA: Diagnosis not present

## 2015-03-27 DIAGNOSIS — C50212 Malignant neoplasm of upper-inner quadrant of left female breast: Secondary | ICD-10-CM

## 2015-03-27 NOTE — Progress Notes (Signed)
Ms. Stracener presents for her 2nd radiation treatment to her Left Breast. She reports some mild fatigue but feels well today. Her skin is normal appearing, and intact. She has no other complaints at this time. I have given her the radiaplex cream to use to help prevent skin breakdown.   BP 155/74 mmHg  Pulse 65  Temp(Src) 98.6 F (37 C)  Ht 5\' 6"  (1.676 m)  Wt 199 lb 9.6 oz (90.538 kg)  BMI 32.23 kg/m2

## 2015-03-27 NOTE — Progress Notes (Signed)
Department of Radiation Oncology  Phone:  [phone] Fax:        [phone]  Weekly Treatment Note    Name: Annette Meadows Date: 03/27/2015 MRN: [id number] DOB: Jun 15, 1940   Current dose: 5 Gy  Current fraction: 2   MEDICATIONS: Current Outpatient Prescriptions  Medication Sig Dispense Refill  . aspirin 81 MG tablet Take 81 mg by mouth daily.    Marland Kitchen atorvastatin (LIPITOR) 40 MG tablet Take 40 mg by mouth daily.    . carvedilol (COREG) 25 MG tablet Take 25 mg by mouth 2 (two) times daily with a meal.    . Cetirizine HCl (ZYRTEC ALLERGY PO) Take 10 mg by mouth daily.    . Cholecalciferol (VITAMIN D) 2000 UNITS tablet Take 2,000 Units by mouth daily.    . cholestyramine light (PREVALITE) 4 G packet Take 4 g by mouth daily.    . Diclofenac Sodium (PENNSAID) 2 % SOLN Place 1 spray onto the skin 2 (two) times daily.    . diclofenac sodium (VOLTAREN) 1 % GEL Apply topically 2 (two) times daily.    Marland Kitchen esomeprazole (NEXIUM) 40 MG capsule Take 40 mg by mouth daily at 12 noon.    . fluticasone (FLONASE) 50 MCG/ACT nasal spray Place 1 spray into both nostrils 2 (two) times daily.    Marland Kitchen gabapentin (NEURONTIN) 300 MG capsule Take 300 mg by mouth 3 (three) times daily.    . hyaluronate sodium (RADIAPLEXRX) GEL Apply 1 application topically 2 (two) times daily.    . Insulin Glargine (LANTUS SOLOSTAR) 100 UNIT/ML Solostar Pen Inject 40 Units into the skin daily at 10 pm.    . levothyroxine (SYNTHROID, LEVOTHROID) 50 MCG tablet Take 50 mcg by mouth daily before breakfast.    . lidocaine-prilocaine (EMLA) cream Apply topically once. Apply cream to port area 1-2 hours before chemo. 1 each 1  . LORazepam (ATIVAN) 0.5 MG tablet Take 1 tablet (0.5 mg total) by mouth at bedtime. 30 tablet 0  . montelukast (SINGULAIR) 10 MG tablet Take 10 mg by mouth at bedtime.    . non-metallic deodorant Jethro Poling) MISC Apply 1 application topically daily as needed.    . SitaGLIPtin-MetFORMIN HCl (JANUMET XR) 50-1000  MG TB24 Take 1,000 mg by mouth 2 (two) times daily.    . traMADol (ULTRAM) 50 MG tablet Take by mouth every 6 (six) hours as needed.    . ondansetron (ZOFRAN) 8 MG tablet Take 1 tablet (8 mg total) by mouth 2 (two) times daily. Start the day after chemo for 3 days. Then take as needed for nausea or vomiting. (Patient not taking: Reported on 03/09/2015) 30 tablet 1  . oxyCODONE (OXY IR/ROXICODONE) 5 MG immediate release tablet     . oxyCODONE-acetaminophen (PERCOCET/ROXICET) 5-325 MG per tablet     . prochlorperazine (COMPAZINE) 10 MG tablet Take 1 tablet (10 mg total) by mouth every 6 (six) hours as needed (Nausea or vomiting). (Patient not taking: Reported on 03/09/2015) 30 tablet 1   No current facility-administered medications for this encounter.     ALLERGIES: Review of patient's allergies indicates no known allergies.   LABORATORY DATA:  Lab Results  Component Value Date   WBC 7.2 03/03/2015   HGB 9.2* 03/03/2015   HCT 30.2* 03/03/2015   MCV 86.0 03/03/2015   PLT 201 03/03/2015   Lab Results  Component Value Date   NA 143 03/03/2015   K 4.5 03/03/2015   CO2 25 03/03/2015   Lab Results  Component Value Date  ALT 14 03/03/2015   AST 16 03/03/2015   ALKPHOS 83 03/03/2015   BILITOT <0.30 03/03/2015     NARRATIVE: Annette Meadows was seen today for weekly treatment management. The chart was checked and the patient's films were reviewed.  [name] presents for her 2nd radiation treatment to her Left Breast. She reports some mild fatigue but feels well today. Her skin is normal appearing, and intact. She has no other complaints at this time. I have given her the radiaplex cream to use to help prevent skin breakdown.   BP 155/74 mmHg  Pulse 65  Temp(Src) 98.6 F (37 C)  Ht 5\' 6"  (1.676 m)  Wt 199 lb 9.6 oz (90.538 kg)  BMI 32.23 kg/m2  PHYSICAL EXAMINATION: height is 5\' 6"  (1.676 m) and weight is 199 lb 9.6 oz (90.538 kg). Her temperature is 98.6 F (37 C). Her blood  pressure is 155/74 and her pulse is 65.        ASSESSMENT: The patient is doing satisfactorily with treatment.  PLAN: We will continue with the patient's radiation treatment as planned.

## 2015-03-27 NOTE — Progress Notes (Signed)
Pt here for patient teaching.  Pt given Radiation and You booklet, skin care instructions, Alra deodorant and Radiaplex gel. Pt reports they have watched the Radiation Therapy Education video.  Reviewed areas of pertinence such as fatigue, skin changes, breast tenderness, breast swelling, cough, shortness of breath, earaches and taste changes . Pt able to give teach back of to pat skin, use unscented/gentle soap and drink plenty of water,apply Radiaplex bid, avoid applying anything to skin within 4 hours of treatment, avoid wearing an under wire bra and to use an electric razor if they must shave. Pt verbalizes understanding of information given and will contact nursing with any questions or concerns.          

## 2015-03-29 NOTE — Progress Notes (Signed)
  Radiation Oncology         [phone] ________________________________  Name: Su Mellas MRN: [id number]  Date: 03/18/2015  DOB: 07/21/1940  Optical Surface Tracking Plan:  Since intensity modulated radiotherapy (IMRT) and 3D conformal radiation treatment methods are predicated on accurate and precise positioning for treatment, intrafraction motion monitoring is medically necessary to ensure accurate and safe treatment delivery.  The ability to quantify intrafraction motion without excessive ionizing radiation dose can only be performed with optical surface tracking. Accordingly, surface imaging offers the opportunity to obtain 3D measurements of patient position throughout IMRT and 3D treatments without excessive radiation exposure.  I am ordering optical surface tracking for this patient's upcoming course of radiotherapy. ________________________________  Kyung Rudd, MD 03/29/2015 7:03 PM    Reference:   Ursula Alert, J, et al. Surface imaging-based analysis of intrafraction motion for breast radiotherapy patients.Journal of [place], n. 6, nov. 2014. ISSN [id number].   Available at: <http://www.jacmp.org/index.php/jacmp/article/view/4957>.

## 2015-03-29 NOTE — Progress Notes (Signed)
  Radiation Oncology         [phone] ________________________________  Name: Annette Meadows MRN: [id number]  Date: 03/18/2015  DOB: Jan 08, 1941  SIMULATION AND TREATMENT PLANNING NOTE  The patient presented for simulation prior to beginning her course of radiation treatment for her diagnosis of left-sided breast cancer. The patient was placed in a supine position on a breast board. A customized vac-lock bag was constructed and this complex treatment device will be used on a daily basis during her treatment. In this fashion, a CT scan was obtained through the chest area and an isocenter was placed near the chest wall within the breast.  The patient will be planned to receive a course of radiation initially to a dose of 42.5 Gy. This will consist of a whole breast radiotherapy technique. To accomplish this, 2 customized blocks have been designed which will correspond to medial and lateral whole breast tangent fields. This treatment will be accomplished at 2.5 Gy per fraction. A forward planning technique will also be evaluated to determine if this approach improves the plan. It is anticipated that the patient will then receive a 7.5 Gy boost to the seroma cavity which has been contoured. This will be accomplished at 2.5 Gy per fraction.   This initial treatment will consist of a 3-D conformal technique. The seroma has been contoured as the primary target structure. Additionally, dose volume histograms of both this target as well as the lungs and heart will also be evaluated. Such an approach is necessary to ensure that the target area is adequately covered while the nearby critical  normal structures are adequately spared.  Plan:  The final anticipated total dose therefore will correspond to 50 Gy.   Special treatment procedure Special treatment procedure was performed today due to the extra time and effort required by myself to plan and prepare this patient for deep inspiration breath hold  technique.  I have determined cardiac sparing to be of benefit to this patient to prevent long term cardiac damage due to radiation of the heart.  Bellows were placed on the patient's abdomen. To facilitate cardiac sparing, the patient was coached by the radiation therapists on breath hold techniques and breathing practice was performed. Practice waveforms were obtained. The patient was then scanned while maintaining breath hold in the treatment position.  This image was then transferred over to the imaging specialist. The imaging specialist then created a fusion of the free breathing and breath hold scans using the chest wall as the stable structure. I personally reviewed the fusion in axial, coronal and sagittal image planes.  Excellent cardiac sparing was obtained.  I felt the patient is an appropriate candidate for breath hold and the patient will be treated as such.  The image fusion was then reviewed with the patient to reinforce the necessity of reproducible breath hold.     _______________________________   Jodelle Gross, MD, PhD

## 2015-03-30 ENCOUNTER — Ambulatory Visit: Payer: Medicare Other

## 2015-03-30 DIAGNOSIS — Z510 Encounter for antineoplastic radiation therapy: Secondary | ICD-10-CM | POA: Diagnosis not present

## 2015-03-31 ENCOUNTER — Inpatient Hospital Stay: Admission: RE | Admit: 2015-03-31 | Payer: Medicare Other | Source: Ambulatory Visit | Admitting: Radiation Oncology

## 2015-03-31 ENCOUNTER — Ambulatory Visit: Payer: Medicare Other | Admitting: Radiation Oncology

## 2015-03-31 ENCOUNTER — Ambulatory Visit
Admission: RE | Admit: 2015-03-31 | Discharge: 2015-03-31 | Disposition: A | Discharge status: Home / Self Care | Payer: Medicare Other | Source: Ambulatory Visit | Attending: Radiation Oncology | Admitting: Radiation Oncology

## 2015-03-31 DIAGNOSIS — Z510 Encounter for antineoplastic radiation therapy: Secondary | ICD-10-CM | POA: Diagnosis not present

## 2015-04-01 ENCOUNTER — Ambulatory Visit
Admission: RE | Admit: 2015-04-01 | Discharge: 2015-04-01 | Disposition: A | Discharge status: Home / Self Care | Payer: Medicare Other | Source: Ambulatory Visit | Attending: Radiation Oncology | Admitting: Radiation Oncology

## 2015-04-01 DIAGNOSIS — Z510 Encounter for antineoplastic radiation therapy: Secondary | ICD-10-CM | POA: Diagnosis not present

## 2015-04-02 ENCOUNTER — Encounter: Payer: Self-pay | Admitting: Radiation Oncology

## 2015-04-02 ENCOUNTER — Telehealth: Payer: Self-pay | Admitting: *Deleted

## 2015-04-02 ENCOUNTER — Ambulatory Visit
Admission: RE | Admit: 2015-04-02 | Discharge: 2015-04-02 | Disposition: A | Discharge status: Home / Self Care | Payer: Medicare Other | Source: Ambulatory Visit | Attending: Radiation Oncology | Admitting: Radiation Oncology

## 2015-04-02 DIAGNOSIS — Z510 Encounter for antineoplastic radiation therapy: Secondary | ICD-10-CM | POA: Diagnosis not present

## 2015-04-02 NOTE — Telephone Encounter (Signed)
Pt returning from [facility] from xrt treatment per daughter. Request call back to discuss needs during xrt.

## 2015-04-03 ENCOUNTER — Ambulatory Visit
Admission: RE | Admit: 2015-04-03 | Discharge: 2015-04-03 | Disposition: A | Discharge status: Home / Self Care | Payer: Medicare Other | Source: Ambulatory Visit | Attending: Radiation Oncology | Admitting: Radiation Oncology

## 2015-04-03 ENCOUNTER — Telehealth: Payer: Self-pay | Admitting: *Deleted

## 2015-04-03 ENCOUNTER — Encounter: Payer: Self-pay | Admitting: Radiation Oncology

## 2015-04-03 VITALS — BP 169/57 | HR 63 | Temp 98.0°F | Resp 63 | Wt 199.1 lb

## 2015-04-03 DIAGNOSIS — C50212 Malignant neoplasm of upper-inner quadrant of left female breast: Secondary | ICD-10-CM

## 2015-04-03 DIAGNOSIS — Z510 Encounter for antineoplastic radiation therapy: Secondary | ICD-10-CM | POA: Diagnosis not present

## 2015-04-03 NOTE — Telephone Encounter (Signed)
Called pt to assess needs during xrt. Relate doing well and without complaints. Encourage pt to call with questions. Received verbal understanding.

## 2015-04-03 NOTE — Progress Notes (Signed)
Department of Radiation Oncology  Phone:  [phone] Fax:        [phone]  Weekly Treatment Note    Name: [name] Date: 04/03/2015 MRN: [id number] DOB: Aug 10, 1940   Current dose: 17.5 Gy  Current fraction: 7   MEDICATIONS: Current Outpatient Prescriptions  Medication Sig Dispense Refill  . aspirin 81 MG tablet Take 81 mg by mouth daily.    Marland Kitchen atorvastatin (LIPITOR) 40 MG tablet Take 40 mg by mouth daily.    . Cetirizine HCl (ZYRTEC ALLERGY PO) Take 10 mg by mouth daily.    . Cholecalciferol (VITAMIN D) 2000 UNITS tablet Take 2,000 Units by mouth daily.    . cholestyramine light (PREVALITE) 4 G packet Take 4 g by mouth daily.    . Diclofenac Sodium (PENNSAID) 2 % SOLN Place 1 spray onto the skin 2 (two) times daily.    . diclofenac sodium (VOLTAREN) 1 % GEL Apply topically 2 (two) times daily.    Marland Kitchen esomeprazole (NEXIUM) 40 MG capsule Take 40 mg by mouth daily at 12 noon.    . fluticasone (FLONASE) 50 MCG/ACT nasal spray Place 1 spray into both nostrils 2 (two) times daily.    Marland Kitchen gabapentin (NEURONTIN) 300 MG capsule Take 300 mg by mouth 3 (three) times daily.    . hyaluronate sodium (RADIAPLEXRX) GEL Apply 1 application topically 2 (two) times daily.    . Insulin Glargine (LANTUS SOLOSTAR) 100 UNIT/ML Solostar Pen Inject 40 Units into the skin daily at 10 pm.    . levothyroxine (SYNTHROID, LEVOTHROID) 50 MCG tablet Take 50 mcg by mouth daily before breakfast.    . lidocaine-prilocaine (EMLA) cream Apply topically once. Apply cream to port area 1-2 hours before chemo. 1 each 1  . LORazepam (ATIVAN) 0.5 MG tablet Take 1 tablet (0.5 mg total) by mouth at bedtime. 30 tablet 0  . montelukast (SINGULAIR) 10 MG tablet Take 10 mg by mouth at bedtime.    . non-metallic deodorant Jethro Poling) MISC Apply 1 application topically daily as needed.    Marland Kitchen oxyCODONE (OXY IR/ROXICODONE) 5 MG immediate release tablet     . oxyCODONE-acetaminophen (PERCOCET/ROXICET) 5-325 MG per tablet       . prochlorperazine (COMPAZINE) 10 MG tablet Take 1 tablet (10 mg total) by mouth every 6 (six) hours as needed (Nausea or vomiting). 30 tablet 1  . SitaGLIPtin-MetFORMIN HCl (JANUMET XR) 50-1000 MG TB24 Take 1,000 mg by mouth 2 (two) times daily.    . traMADol (ULTRAM) 50 MG tablet Take by mouth every 6 (six) hours as needed.    Marland Kitchen lisinopril (PRINIVIL,ZESTRIL) 40 MG tablet Take 40 mg by mouth daily.     No current facility-administered medications for this encounter.     ALLERGIES: Review of patient's allergies indicates not on file.   LABORATORY DATA:  Lab Results  Component Value Date   WBC 7.2 03/03/2015   HGB 9.2* 03/03/2015   HCT 30.2* 03/03/2015   MCV 86.0 03/03/2015   PLT 201 03/03/2015   Lab Results  Component Value Date   NA 143 03/03/2015   K 4.5 03/03/2015   CO2 25 03/03/2015   Lab Results  Component Value Date   ALT 14 03/03/2015   AST 16 03/03/2015   ALKPHOS 83 03/03/2015   BILITOT <0.30 03/03/2015     NARRATIVE: Annette Meadows was seen today for weekly treatment management. The chart was checked and the patient's films were reviewed.  Annette Meadows presents for her 7th radiation treatment to her  Left Breast. She states that when she puts her left arm up, it feels like there is some tightness on her breast. Her skin feels normal. She is using radiaplex twice a day. Her appetite is good.  BP 169/57 mmHg  Pulse 63  Temp(Src) 98 F (36.7 C) (Oral)  Resp 63  Wt 199 lb 1.6 oz (90.311 kg)  PHYSICAL EXAMINATION: weight is 199 lb 1.6 oz (90.311 kg). Her oral temperature is 98 F (36.7 C). Her blood pressure is 169/57 and her pulse is 63. Her respiration is 63.        ASSESSMENT: The patient is doing satisfactorily with treatment.  PLAN: We will continue with the patient's radiation treatment as planned.    This document serves as a record of services personally performed by Kyung Rudd, MD. It was created on his behalf by  Lendon Collar, a trained medical  scribe. The creation of this record is based on the scribe's personal observations and the provider's statements to them. This document has been checked and approved by the attending provider.

## 2015-04-03 NOTE — Progress Notes (Addendum)
Weekly rad txs 7/20 completed, no skin changes, using radiaplex bid, appetite good No c/o pain now  BP 169/57 mmHg  Pulse 63  Temp(Src) 98 F (36.7 C) (Oral)  Resp 63  Wt 199 lb 1.6 oz (90.311 kg)  Wt Readings from Last 3 Encounters:  04/03/15 199 lb 1.6 oz (90.311 kg)  03/27/15 199 lb 9.6 oz (90.538 kg)  03/09/15 196 lb (88.905 kg)   9:51 AM

## 2015-04-05 ENCOUNTER — Ambulatory Visit
Admission: RE | Admit: 2015-04-05 | Discharge: 2015-04-05 | Disposition: A | Discharge status: Home / Self Care | Payer: Medicare Other | Source: Ambulatory Visit | Attending: Radiation Oncology | Admitting: Radiation Oncology

## 2015-04-05 DIAGNOSIS — Z510 Encounter for antineoplastic radiation therapy: Secondary | ICD-10-CM | POA: Diagnosis not present

## 2015-04-06 ENCOUNTER — Ambulatory Visit
Admission: RE | Admit: 2015-04-06 | Discharge: 2015-04-06 | Disposition: A | Discharge status: Home / Self Care | Payer: Medicare Other | Source: Ambulatory Visit | Attending: Radiation Oncology | Admitting: Radiation Oncology

## 2015-04-06 DIAGNOSIS — Z510 Encounter for antineoplastic radiation therapy: Secondary | ICD-10-CM | POA: Diagnosis not present

## 2015-04-07 ENCOUNTER — Ambulatory Visit: Admission: RE | Admit: 2015-04-07 | Payer: Medicare Other | Source: Ambulatory Visit | Admitting: Radiation Oncology

## 2015-04-07 ENCOUNTER — Ambulatory Visit
Admission: RE | Admit: 2015-04-07 | Discharge: 2015-04-07 | Disposition: A | Discharge status: Home / Self Care | Payer: Medicare Other | Source: Ambulatory Visit | Attending: Radiation Oncology | Admitting: Radiation Oncology

## 2015-04-07 DIAGNOSIS — Z510 Encounter for antineoplastic radiation therapy: Secondary | ICD-10-CM | POA: Diagnosis not present

## 2015-04-08 ENCOUNTER — Ambulatory Visit
Admission: RE | Admit: 2015-04-08 | Discharge: 2015-04-08 | Disposition: A | Discharge status: Home / Self Care | Payer: Medicare Other | Source: Ambulatory Visit | Attending: Radiation Oncology | Admitting: Radiation Oncology

## 2015-04-08 DIAGNOSIS — Z510 Encounter for antineoplastic radiation therapy: Secondary | ICD-10-CM | POA: Diagnosis not present

## 2015-04-13 ENCOUNTER — Ambulatory Visit
Admission: RE | Admit: 2015-04-13 | Discharge: 2015-04-13 | Disposition: A | Discharge status: Home / Self Care | Payer: Medicare Other | Source: Ambulatory Visit | Attending: Radiation Oncology | Admitting: Radiation Oncology

## 2015-04-13 DIAGNOSIS — Z510 Encounter for antineoplastic radiation therapy: Secondary | ICD-10-CM | POA: Diagnosis not present

## 2015-04-14 ENCOUNTER — Ambulatory Visit
Admission: RE | Admit: 2015-04-14 | Discharge: 2015-04-14 | Disposition: A | Discharge status: Home / Self Care | Payer: Medicare Other | Source: Ambulatory Visit | Attending: Radiation Oncology | Admitting: Radiation Oncology

## 2015-04-14 DIAGNOSIS — Z510 Encounter for antineoplastic radiation therapy: Secondary | ICD-10-CM | POA: Diagnosis not present

## 2015-04-15 ENCOUNTER — Ambulatory Visit
Admission: RE | Admit: 2015-04-15 | Discharge: 2015-04-15 | Disposition: A | Discharge status: Home / Self Care | Payer: Medicare Other | Source: Ambulatory Visit | Attending: Radiation Oncology | Admitting: Radiation Oncology

## 2015-04-15 ENCOUNTER — Encounter: Payer: Self-pay | Admitting: Radiation Oncology

## 2015-04-15 VITALS — BP 146/59 | HR 75 | Temp 98.2°F | Resp 20 | Wt 200.0 lb

## 2015-04-15 DIAGNOSIS — Z510 Encounter for antineoplastic radiation therapy: Secondary | ICD-10-CM | POA: Diagnosis not present

## 2015-04-15 DIAGNOSIS — C50212 Malignant neoplasm of upper-inner quadrant of left female breast: Secondary | ICD-10-CM

## 2015-04-15 NOTE — Progress Notes (Signed)
weekly rad txs  Left breast,14/20 done, skin intact, mild tanning under axilla,   Patient misunderstanding of eating frozen tv dinners, thought someone told her to eat that instead of fresh foods, informed her too much sodium  In frozen dinners and aslo to lay off bacon, sausage pork,hot dogs, eat mor lean protein meats in diet, drink more water lay off sodas on new b/p med norvasc, daily now, b/p much better this week; BP 146/59 mmHg  Pulse 75  Temp(Src) 98.2 F (36.8 C) (Oral)  Resp 20  Wt 200 lb (90.719 kg)  Wt Readings from Last 3 Encounters:  04/15/15 200 lb (90.719 kg)  04/07/15 199 lb 9.6 oz (90.538 kg)  04/03/15 199 lb 1.6 oz (90.311 kg)

## 2015-04-15 NOTE — Progress Notes (Signed)
Department of Radiation Oncology  Phone:  [phone] Fax:        [phone]  Weekly Treatment Note    Name: Annette Meadows Date: 04/15/2015 MRN: [id number] DOB: 08/12/1940   Current dose: 35 Gy  Current fraction:14   MEDICATIONS: Current Outpatient Prescriptions  Medication Sig Dispense Refill  . amLODipine (NORVASC) 10 MG tablet Take 10 mg by mouth daily.    Marland Kitchen amoxicillin (AMOXIL) 500 MG capsule Take 1,500 mg by mouth daily.    Marland Kitchen aspirin 81 MG tablet Take 81 mg by mouth daily.    Marland Kitchen atorvastatin (LIPITOR) 40 MG tablet Take 40 mg by mouth daily.    . carvedilol (COREG) 25 MG tablet Take 25 mg by mouth daily.    . Cetirizine HCl (ZYRTEC ALLERGY PO) Take 10 mg by mouth daily.    . Cholecalciferol (VITAMIN D) 2000 UNITS tablet Take 2,000 Units by mouth daily.    . cholestyramine light (PREVALITE) 4 G packet Take 4 g by mouth daily.    . Diclofenac Sodium (PENNSAID) 2 % SOLN Place 1 spray onto the skin 2 (two) times daily.    . diclofenac sodium (VOLTAREN) 1 % GEL Apply topically 2 (two) times daily.    Marland Kitchen esomeprazole (NEXIUM) 40 MG capsule Take 40 mg by mouth daily at 12 noon.    . fluticasone (FLONASE) 50 MCG/ACT nasal spray Place 1 spray into both nostrils 2 (two) times daily.    Marland Kitchen gabapentin (NEURONTIN) 300 MG capsule Take 300 mg by mouth 3 (three) times daily.    . hyaluronate sodium (RADIAPLEXRX) GEL Apply 1 application topically 2 (two) times daily.    . Insulin Glargine (LANTUS SOLOSTAR) 100 UNIT/ML Solostar Pen Inject 40 Units into the skin daily at 10 pm.    . levothyroxine (SYNTHROID, LEVOTHROID) 50 MCG tablet Take 50 mcg by mouth daily before breakfast.    . lidocaine-prilocaine (EMLA) cream Apply topically once. Apply cream to port area 1-2 hours before chemo. 1 each 1  . lisinopril (PRINIVIL,ZESTRIL) 40 MG tablet Take 40 mg by mouth daily.    Marland Kitchen LORazepam (ATIVAN) 0.5 MG tablet Take 1 tablet (0.5 mg total) by mouth at bedtime. 30 tablet 0  . LUMIGAN 0.01 % SOLN  Place 1 drop into both eyes.    . montelukast (SINGULAIR) 10 MG tablet Take 10 mg by mouth at bedtime.    . non-metallic deodorant Jethro Poling) MISC Apply 1 application topically daily as needed.    Marland Kitchen oxyCODONE (OXY IR/ROXICODONE) 5 MG immediate release tablet     . oxyCODONE-acetaminophen (PERCOCET/ROXICET) 5-325 MG per tablet     . prochlorperazine (COMPAZINE) 10 MG tablet Take 1 tablet (10 mg total) by mouth every 6 (six) hours as needed (Nausea or vomiting). 30 tablet 1  . SitaGLIPtin-MetFORMIN HCl (JANUMET XR) 50-1000 MG TB24 Take 1,000 mg by mouth 2 (two) times daily.    . SURE COMFORT PEN NEEDLES 31G X 5 MM MISC     . traMADol (ULTRAM) 50 MG tablet Take by mouth every 6 (six) hours as needed.     No current facility-administered medications for this encounter.   Current Outpatient Prescriptions  Medication Sig Dispense Refill  . amLODipine (NORVASC) 10 MG tablet Take 10 mg by mouth daily.    Marland Kitchen amoxicillin (AMOXIL) 500 MG capsule Take 1,500 mg by mouth daily.    Marland Kitchen aspirin 81 MG tablet Take 81 mg by mouth daily.    Marland Kitchen atorvastatin (LIPITOR) 40 MG tablet Take 40 mg  by mouth daily.    . carvedilol (COREG) 25 MG tablet Take 25 mg by mouth daily.    . Cetirizine HCl (ZYRTEC ALLERGY PO) Take 10 mg by mouth daily.    . Cholecalciferol (VITAMIN D) 2000 UNITS tablet Take 2,000 Units by mouth daily.    . cholestyramine light (PREVALITE) 4 G packet Take 4 g by mouth daily.    . Diclofenac Sodium (PENNSAID) 2 % SOLN Place 1 spray onto the skin 2 (two) times daily.    . diclofenac sodium (VOLTAREN) 1 % GEL Apply topically 2 (two) times daily.    Marland Kitchen esomeprazole (NEXIUM) 40 MG capsule Take 40 mg by mouth daily at 12 noon.    . fluticasone (FLONASE) 50 MCG/ACT nasal spray Place 1 spray into both nostrils 2 (two) times daily.    Marland Kitchen gabapentin (NEURONTIN) 300 MG capsule Take 300 mg by mouth 3 (three) times daily.    . hyaluronate sodium (RADIAPLEXRX) GEL Apply 1 application topically 2 (two) times daily.      . Insulin Glargine (LANTUS SOLOSTAR) 100 UNIT/ML Solostar Pen Inject 40 Units into the skin daily at 10 pm.    . levothyroxine (SYNTHROID, LEVOTHROID) 50 MCG tablet Take 50 mcg by mouth daily before breakfast.    . lidocaine-prilocaine (EMLA) cream Apply topically once. Apply cream to port area 1-2 hours before chemo. 1 each 1  . lisinopril (PRINIVIL,ZESTRIL) 40 MG tablet Take 40 mg by mouth daily.    Marland Kitchen LORazepam (ATIVAN) 0.5 MG tablet Take 1 tablet (0.5 mg total) by mouth at bedtime. 30 tablet 0  . LUMIGAN 0.01 % SOLN Place 1 drop into both eyes.    . montelukast (SINGULAIR) 10 MG tablet Take 10 mg by mouth at bedtime.    . non-metallic deodorant Jethro Poling) MISC Apply 1 application topically daily as needed.    Marland Kitchen oxyCODONE (OXY IR/ROXICODONE) 5 MG immediate release tablet     . oxyCODONE-acetaminophen (PERCOCET/ROXICET) 5-325 MG per tablet     . prochlorperazine (COMPAZINE) 10 MG tablet Take 1 tablet (10 mg total) by mouth every 6 (six) hours as needed (Nausea or vomiting). 30 tablet 1  . SitaGLIPtin-MetFORMIN HCl (JANUMET XR) 50-1000 MG TB24 Take 1,000 mg by mouth 2 (two) times daily.    . SURE COMFORT PEN NEEDLES 31G X 5 MM MISC     . traMADol (ULTRAM) 50 MG tablet Take by mouth every 6 (six) hours as needed.     No current facility-administered medications for this encounter.     ALLERGIES: Review of patient's allergies indicates not on file.   LABORATORY DATA:  Lab Results  Component Value Date   WBC 7.2 03/03/2015   HGB 9.2* 03/03/2015   HCT 30.2* 03/03/2015   MCV 86.0 03/03/2015   PLT 201 03/03/2015   Lab Results  Component Value Date   NA 143 03/03/2015   K 4.5 03/03/2015   CO2 25 03/03/2015   Lab Results  Component Value Date   ALT 14 03/03/2015   AST 16 03/03/2015   ALKPHOS 83 03/03/2015   BILITOT <0.30 03/03/2015     NARRATIVE: Annette Meadows was seen today for weekly treatment management. The chart was checked and the patient's films were reviewed.  14/20  treatments complete to the left breast. Skin intact, mild tanning under axilla, Patient misunderstood of eating frozen tv dinners, thought someone told her to eat that instead of fresh foods. The nurse informed her that there is too much sodium In frozen dinners and  also to lay off bacon, sausage pork, hot dogs, eat more lean protein meats in diet, drink more water, lay off sodas. She is on new b/p med (norvasc), daily now, b/p much better this week.  PHYSICAL EXAMINATION: weight is 200 lb (90.719 kg). Her oral temperature is 98.2 F (36.8 C). Her blood pressure is 146/59 and her pulse is 75. Her respiration is 20.  Minimal hyperpigmentation present in the treatment area. Mild desquamation.  ASSESSMENT: The patient is doing satisfactorily with treatment.  PLAN: We will continue with the patient's radiation treatment as planned.   ------------------------------------------------  Jodelle Gross, MD, PhD  This document serves as a record of services personally performed by Kyung Rudd, MD. It was created on his behalf by Darcus Austin, a trained medical scribe. The creation of this record is based on the scribe's personal observations and the provider's statements to them. This document has been checked and approved by the attending provider.

## 2015-04-16 ENCOUNTER — Encounter: Payer: Self-pay | Admitting: Radiation Oncology

## 2015-04-16 ENCOUNTER — Ambulatory Visit
Admission: RE | Admit: 2015-04-16 | Discharge: 2015-04-16 | Disposition: A | Discharge status: Home / Self Care | Payer: Medicare Other | Source: Ambulatory Visit | Attending: Radiation Oncology | Admitting: Radiation Oncology

## 2015-04-16 DIAGNOSIS — Z510 Encounter for antineoplastic radiation therapy: Secondary | ICD-10-CM | POA: Diagnosis not present

## 2015-04-17 ENCOUNTER — Other Ambulatory Visit: Payer: Self-pay

## 2015-04-17 ENCOUNTER — Ambulatory Visit
Admission: RE | Admit: 2015-04-17 | Discharge: 2015-04-17 | Disposition: A | Discharge status: Home / Self Care | Payer: Medicare Other | Source: Ambulatory Visit | Attending: Radiation Oncology | Admitting: Radiation Oncology

## 2015-04-17 ENCOUNTER — Telehealth: Payer: Self-pay | Admitting: Hematology and Oncology

## 2015-04-17 DIAGNOSIS — Z510 Encounter for antineoplastic radiation therapy: Secondary | ICD-10-CM | POA: Diagnosis not present

## 2015-04-17 DIAGNOSIS — C50212 Malignant neoplasm of upper-inner quadrant of left female breast: Secondary | ICD-10-CM

## 2015-04-17 NOTE — Progress Notes (Signed)
Department of Radiation Oncology  Phone:  [phone] Fax:        [phone]  Weekly Treatment Note    Name: Annette Meadows Date: 04/17/2015 MRN: [id number] DOB: [birth year]   Current dose: 40 Gy  Current fraction: 16   MEDICATIONS: Current Outpatient Prescriptions  Medication Sig Dispense Refill  . amLODipine (NORVASC) 10 MG tablet Take 10 mg by mouth daily.    Marland Kitchen amoxicillin (AMOXIL) 500 MG capsule Take 1,500 mg by mouth daily.    Marland Kitchen aspirin 81 MG tablet Take 81 mg by mouth daily.    Marland Kitchen atorvastatin (LIPITOR) 40 MG tablet Take 40 mg by mouth daily.    . carvedilol (COREG) 25 MG tablet Take 25 mg by mouth daily.    . Cetirizine HCl (ZYRTEC ALLERGY PO) Take 10 mg by mouth daily.    . Cholecalciferol (VITAMIN D) 2000 UNITS tablet Take 2,000 Units by mouth daily.    . cholestyramine light (PREVALITE) 4 G packet Take 4 g by mouth daily.    . Diclofenac Sodium (PENNSAID) 2 % SOLN Place 1 spray onto the skin 2 (two) times daily.    . diclofenac sodium (VOLTAREN) 1 % GEL Apply topically 2 (two) times daily.    Marland Kitchen esomeprazole (NEXIUM) 40 MG capsule Take 40 mg by mouth daily at 12 noon.    . fluticasone (FLONASE) 50 MCG/ACT nasal spray Place 1 spray into both nostrils 2 (two) times daily.    Marland Kitchen gabapentin (NEURONTIN) 300 MG capsule Take 300 mg by mouth 3 (three) times daily.    . hyaluronate sodium (RADIAPLEXRX) GEL Apply 1 application topically 2 (two) times daily.    . Insulin Glargine (LANTUS SOLOSTAR) 100 UNIT/ML Solostar Pen Inject 40 Units into the skin daily at 10 pm.    . levothyroxine (SYNTHROID, LEVOTHROID) 50 MCG tablet Take 50 mcg by mouth daily before breakfast.    . lidocaine-prilocaine (EMLA) cream Apply topically once. Apply cream to port area 1-2 hours before chemo. 1 each 1  . lisinopril (PRINIVIL,ZESTRIL) 40 MG tablet Take 40 mg by mouth daily.    Marland Kitchen LORazepam (ATIVAN) 0.5 MG tablet Take 1 tablet (0.5 mg total) by mouth at bedtime. 30 tablet 0  . LUMIGAN 0.01 % SOLN  Place 1 drop into both eyes.    . montelukast (SINGULAIR) 10 MG tablet Take 10 mg by mouth at bedtime.    . non-metallic deodorant Jethro Poling) MISC Apply 1 application topically daily as needed.    Marland Kitchen oxyCODONE (OXY IR/ROXICODONE) 5 MG immediate release tablet     . oxyCODONE-acetaminophen (PERCOCET/ROXICET) 5-325 MG per tablet     . prochlorperazine (COMPAZINE) 10 MG tablet Take 1 tablet (10 mg total) by mouth every 6 (six) hours as needed (Nausea or vomiting). 30 tablet 1  . SitaGLIPtin-MetFORMIN HCl (JANUMET XR) 50-1000 MG TB24 Take 1,000 mg by mouth 2 (two) times daily.    . SURE COMFORT PEN NEEDLES 31G X 5 MM MISC     . traMADol (ULTRAM) 50 MG tablet Take by mouth every 6 (six) hours as needed.     No current facility-administered medications for this encounter.   Current Outpatient Prescriptions  Medication Sig Dispense Refill  . amLODipine (NORVASC) 10 MG tablet Take 10 mg by mouth daily.    Marland Kitchen amoxicillin (AMOXIL) 500 MG capsule Take 1,500 mg by mouth daily.    Marland Kitchen aspirin 81 MG tablet Take 81 mg by mouth daily.    Marland Kitchen atorvastatin (LIPITOR) 40 MG tablet Take 40  mg by mouth daily.    . carvedilol (COREG) 25 MG tablet Take 25 mg by mouth daily.    . Cetirizine HCl (ZYRTEC ALLERGY PO) Take 10 mg by mouth daily.    . Cholecalciferol (VITAMIN D) 2000 UNITS tablet Take 2,000 Units by mouth daily.    . cholestyramine light (PREVALITE) 4 G packet Take 4 g by mouth daily.    . Diclofenac Sodium (PENNSAID) 2 % SOLN Place 1 spray onto the skin 2 (two) times daily.    . diclofenac sodium (VOLTAREN) 1 % GEL Apply topically 2 (two) times daily.    Marland Kitchen esomeprazole (NEXIUM) 40 MG capsule Take 40 mg by mouth daily at 12 noon.    . fluticasone (FLONASE) 50 MCG/ACT nasal spray Place 1 spray into both nostrils 2 (two) times daily.    Marland Kitchen gabapentin (NEURONTIN) 300 MG capsule Take 300 mg by mouth 3 (three) times daily.    . hyaluronate sodium (RADIAPLEXRX) GEL Apply 1 application topically 2 (two) times daily.      . Insulin Glargine (LANTUS SOLOSTAR) 100 UNIT/ML Solostar Pen Inject 40 Units into the skin daily at 10 pm.    . levothyroxine (SYNTHROID, LEVOTHROID) 50 MCG tablet Take 50 mcg by mouth daily before breakfast.    . lidocaine-prilocaine (EMLA) cream Apply topically once. Apply cream to port area 1-2 hours before chemo. 1 each 1  . lisinopril (PRINIVIL,ZESTRIL) 40 MG tablet Take 40 mg by mouth daily.    Marland Kitchen LORazepam (ATIVAN) 0.5 MG tablet Take 1 tablet (0.5 mg total) by mouth at bedtime. 30 tablet 0  . LUMIGAN 0.01 % SOLN Place 1 drop into both eyes.    . montelukast (SINGULAIR) 10 MG tablet Take 10 mg by mouth at bedtime.    . non-metallic deodorant Jethro Poling) MISC Apply 1 application topically daily as needed.    Marland Kitchen oxyCODONE (OXY IR/ROXICODONE) 5 MG immediate release tablet     . oxyCODONE-acetaminophen (PERCOCET/ROXICET) 5-325 MG per tablet     . prochlorperazine (COMPAZINE) 10 MG tablet Take 1 tablet (10 mg total) by mouth every 6 (six) hours as needed (Nausea or vomiting). 30 tablet 1  . SitaGLIPtin-MetFORMIN HCl (JANUMET XR) 50-1000 MG TB24 Take 1,000 mg by mouth 2 (two) times daily.    . SURE COMFORT PEN NEEDLES 31G X 5 MM MISC     . traMADol (ULTRAM) 50 MG tablet Take by mouth every 6 (six) hours as needed.     No current facility-administered medications for this encounter.     ALLERGIES: Review of patient's allergies indicates not on file.   LABORATORY DATA:  Lab Results  Component Value Date   WBC 7.2 03/03/2015   HGB 9.2* 03/03/2015   HCT 30.2* 03/03/2015   MCV 86.0 03/03/2015   PLT 201 03/03/2015   Lab Results  Component Value Date   NA 143 03/03/2015   K 4.5 03/03/2015   CO2 25 03/03/2015   Lab Results  Component Value Date   ALT 14 03/03/2015   AST 16 03/03/2015   ALKPHOS 83 03/03/2015   BILITOT <0.30 03/03/2015     NARRATIVE: Annette Meadows was seen today for weekly treatment management. The chart was checked and the patient's films were reviewed.  16/20  treatments complete to the left breast. Patient is doing well this week. She states her skin has not been too irritated. She continues to use skin cream daily.  PHYSICAL EXAMINATION: vitals were not taken for this visit. Diffuse hyperpigmentation present. No desquamation.  ASSESSMENT: The patient is doing satisfactorily with treatment.  PLAN: We will continue with the patient's radiation treatment as planned.   ------------------------------------------------  Jodelle Gross, MD, PhD  This document serves as a record of services personally performed by Tyler Pita, MD. It was created on his behalf by Lendon Collar, a trained medical scribe. The creation of this record is based on the scribe's personal observations and the provider's statements to them. This document has been checked and approved by the attending provider.

## 2015-04-17 NOTE — Progress Notes (Signed)
Patient seen in the back on the linac table by Md not sent to nursing for assessment vitals not done 10:22 AM

## 2015-04-17 NOTE — Telephone Encounter (Signed)
Appointment made per pof and a calendar was printed for the patient

## 2015-04-20 ENCOUNTER — Ambulatory Visit: Payer: Medicare Other | Admitting: Radiation Oncology

## 2015-04-20 ENCOUNTER — Ambulatory Visit
Admission: RE | Admit: 2015-04-20 | Discharge: 2015-04-20 | Disposition: A | Discharge status: Home / Self Care | Payer: Medicare Other | Source: Ambulatory Visit | Attending: Radiation Oncology | Admitting: Radiation Oncology

## 2015-04-20 DIAGNOSIS — Z510 Encounter for antineoplastic radiation therapy: Secondary | ICD-10-CM | POA: Diagnosis not present

## 2015-04-21 ENCOUNTER — Ambulatory Visit: Payer: Medicare Other

## 2015-04-21 ENCOUNTER — Ambulatory Visit
Admission: RE | Admit: 2015-04-21 | Discharge: 2015-04-21 | Disposition: A | Discharge status: Home / Self Care | Payer: Medicare Other | Source: Ambulatory Visit | Attending: Radiation Oncology | Admitting: Radiation Oncology

## 2015-04-21 DIAGNOSIS — Z510 Encounter for antineoplastic radiation therapy: Secondary | ICD-10-CM | POA: Diagnosis not present

## 2015-04-22 ENCOUNTER — Ambulatory Visit
Admission: RE | Admit: 2015-04-22 | Discharge: 2015-04-22 | Disposition: A | Discharge status: Home / Self Care | Payer: Medicare Other | Source: Ambulatory Visit | Attending: Radiation Oncology | Admitting: Radiation Oncology

## 2015-04-22 ENCOUNTER — Ambulatory Visit: Payer: Medicare Other

## 2015-04-22 DIAGNOSIS — Z510 Encounter for antineoplastic radiation therapy: Secondary | ICD-10-CM | POA: Diagnosis not present

## 2015-04-23 ENCOUNTER — Ambulatory Visit: Payer: Medicare Other

## 2015-04-23 ENCOUNTER — Encounter: Payer: Self-pay | Admitting: Radiation Oncology

## 2015-04-23 ENCOUNTER — Ambulatory Visit
Admission: RE | Admit: 2015-04-23 | Discharge: 2015-04-23 | Disposition: A | Discharge status: Home / Self Care | Payer: Medicare Other | Source: Ambulatory Visit | Attending: Radiation Oncology | Admitting: Radiation Oncology

## 2015-04-23 DIAGNOSIS — Z510 Encounter for antineoplastic radiation therapy: Secondary | ICD-10-CM | POA: Diagnosis not present

## 2015-04-24 ENCOUNTER — Telehealth: Payer: Self-pay | Admitting: *Deleted

## 2015-04-24 ENCOUNTER — Ambulatory Visit: Payer: Medicare Other

## 2015-04-24 ENCOUNTER — Other Ambulatory Visit: Payer: Self-pay | Admitting: *Deleted

## 2015-04-24 ENCOUNTER — Ambulatory Visit (HOSPITAL_BASED_OUTPATIENT_CLINIC_OR_DEPARTMENT_OTHER): Payer: Medicare Other | Admitting: Hematology and Oncology

## 2015-04-24 ENCOUNTER — Encounter: Payer: Self-pay | Admitting: Hematology and Oncology

## 2015-04-24 ENCOUNTER — Telehealth: Payer: Self-pay | Admitting: Hematology and Oncology

## 2015-04-24 VITALS — BP 133/60 | HR 77 | Temp 98.3°F | Resp 18 | Ht 66.0 in | Wt 194.1 lb

## 2015-04-24 DIAGNOSIS — C50212 Malignant neoplasm of upper-inner quadrant of left female breast: Secondary | ICD-10-CM

## 2015-04-24 MED ORDER — RADIAPLEXRX EX GEL: 1.0000 "application " | Freq: Two times a day (BID) | CUTANEOUS | Status: DC

## 2015-04-24 NOTE — Progress Notes (Signed)
Patient Care Team: Pcp Not In System as PCP - General  DIAGNOSIS: No matching staging information was found for the patient.  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 equivocal 2+ by IHC, FISH negative ratio 1.3   11/26/2014 Surgery left lumpectomy invasive lobular cancer, grade 3, multifocal, 2.5 cm and 0.25 cm, margins negative, ER 0%, PR 0%, HER-2 -1/11 micro-met 0.3 cm   12/23/2014 - 03/03/2015 Chemotherapy Adjuvant chemotherapy with Taxotere and Cytoxan every 3 weeks 4 cycles   03/26/2015 - 04/23/2015 Radiation Therapy Adjuvant radiation therapy    CHIEF COMPLIANT: follow-up after radiation therapy  INTERVAL HISTORY: Annette Meadows is a 74 year old with above-mentioned left breast cancer who finished lumpectomy followed by adjuvant chemotherapy and radiation therapy. She is here after the conclusion of radiation. She has tolerated radiation extremely well without any major problems or concerns. She is very excited about going back to her home in [facility].  REVIEW OF SYSTEMS:   Constitutional: Denies fevers, chills or abnormal weight loss Eyes: Denies blurriness of vision Ears, nose, mouth, throat, and face: Denies mucositis or sore throat Respiratory: Denies cough, dyspnea or wheezes Cardiovascular: Denies palpitation, chest discomfort or lower extremity swelling Gastrointestinal:  Denies nausea, heartburn or change in bowel habits Skin: Denies abnormal skin rashes Lymphatics: Denies new lymphadenopathy or easy bruising Neurological:Denies numbness, tingling or new weaknesses Behavioral/Psych: Mood is stable, no new changes  Breast:  denies any pain or lumps or nodules in either breasts All other systems were reviewed with the patient and are negative.  I have reviewed the past medical  history, past surgical history, social history and family history with the patient and they are unchanged from previous note.  ALLERGIES:  has no allergies on file.  MEDICATIONS:  Current Outpatient Prescriptions  Medication Sig Dispense Refill  . amLODipine (NORVASC) 10 MG tablet Take 10 mg by mouth daily.    Marland Kitchen amoxicillin (AMOXIL) 500 MG capsule Take 1,500 mg by mouth daily.    Marland Kitchen aspirin 81 MG tablet Take 81 mg by mouth daily.    Marland Kitchen atorvastatin (LIPITOR) 40 MG tablet Take 40 mg by mouth daily.    . carvedilol (COREG) 25 MG tablet Take 25 mg by mouth daily.    . Cetirizine HCl (ZYRTEC ALLERGY PO) Take 10 mg by mouth daily.    . Cholecalciferol (VITAMIN D) 2000 UNITS tablet Take 2,000 Units by mouth daily.    . cholestyramine light (PREVALITE) 4 G packet Take 4 g by mouth daily.    . Diclofenac Sodium (PENNSAID) 2 % SOLN Place 1 spray onto the skin 2 (two) times daily.    . diclofenac sodium (VOLTAREN) 1 % GEL Apply topically 2 (two) times daily.    Marland Kitchen esomeprazole (NEXIUM) 40 MG capsule Take 40 mg by mouth daily at 12 noon.    . fluticasone (FLONASE) 50 MCG/ACT nasal spray Place 1 spray into both nostrils 2 (two) times daily.    Marland Kitchen gabapentin (NEURONTIN) 300 MG capsule Take 300 mg by mouth 3 (three) times daily.    . hyaluronate sodium (RADIAPLEXRX) GEL Apply 1 application topically 2 (two) times daily.    . Insulin Glargine (LANTUS SOLOSTAR) 100 UNIT/ML Solostar Pen Inject 40 Units into the skin daily at 10 pm.    . levothyroxine (SYNTHROID, LEVOTHROID) 50 MCG tablet Take 50 mcg by  mouth daily before breakfast.    . lidocaine-prilocaine (EMLA) cream Apply topically once. Apply cream to port area 1-2 hours before chemo. 1 each 1  . lisinopril (PRINIVIL,ZESTRIL) 40 MG tablet Take 40 mg by mouth daily.    Marland Kitchen LORazepam (ATIVAN) 0.5 MG tablet Take 1 tablet (0.5 mg total) by mouth at bedtime. 30 tablet 0  . LUMIGAN 0.01 % SOLN Place 1 drop into both eyes.    . montelukast (SINGULAIR) 10 MG tablet  Take 10 mg by mouth at bedtime.    . non-metallic deodorant Jethro Poling) MISC Apply 1 application topically daily as needed.    Marland Kitchen oxyCODONE (OXY IR/ROXICODONE) 5 MG immediate release tablet     . oxyCODONE-acetaminophen (PERCOCET/ROXICET) 5-325 MG per tablet     . prochlorperazine (COMPAZINE) 10 MG tablet Take 1 tablet (10 mg total) by mouth every 6 (six) hours as needed (Nausea or vomiting). 30 tablet 1  . SitaGLIPtin-MetFORMIN HCl (JANUMET XR) 50-1000 MG TB24 Take 1,000 mg by mouth 2 (two) times daily.    . SURE COMFORT PEN NEEDLES 31G X 5 MM MISC     . traMADol (ULTRAM) 50 MG tablet Take by mouth every 6 (six) hours as needed.     No current facility-administered medications for this visit.    PHYSICAL EXAMINATION: ECOG PERFORMANCE STATUS: 1 - Symptomatic but completely ambulatory  Filed Vitals:   04/24/15 1123  BP: 133/60  Pulse: 77  Temp: 98.3 F (36.8 C)  Resp: 18   Filed Weights   04/24/15 1123  Weight: 194 lb 1.6 oz (88.043 kg)    GENERAL:alert, no distress and comfortable SKIN: skin color, texture, turgor are normal, no rashes or significant lesions EYES: normal, Conjunctiva are pink and non-injected, sclera clear OROPHARYNX:no exudate, no erythema and lips, buccal mucosa, and tongue normal  NECK: supple, thyroid normal size, non-tender, without nodularity LYMPH:  no palpable lymphadenopathy in the cervical, axillary or inguinal LUNGS: clear to auscultation and percussion with normal breathing effort HEART: regular rate & rhythm and no murmurs and no lower extremity edema ABDOMEN:abdomen soft, non-tender and normal bowel sounds Musculoskeletal:no cyanosis of digits and no clubbing  NEURO: alert & oriented x 3 with fluent speech, no focal motor/sensory deficits  LABORATORY DATA:  I have reviewed the data as listed   Chemistry      Component Value Date/Time   NA 143 03/03/2015 1105   K 4.5 03/03/2015 1105   CO2 25 03/03/2015 1105   BUN 19.0 03/03/2015 1105    CREATININE 1.1 03/03/2015 1105      Component Value Date/Time   CALCIUM 9.5 03/03/2015 1105   ALKPHOS 83 03/03/2015 1105   AST 16 03/03/2015 1105   ALT 14 03/03/2015 1105   BILITOT <0.30 03/03/2015 1105       Lab Results  Component Value Date   WBC 7.2 03/03/2015   HGB 9.2* 03/03/2015   HCT 30.2* 03/03/2015   MCV 86.0 03/03/2015   PLT 201 03/03/2015   NEUTROABS 5.5 03/03/2015   ASSESSMENT & PLAN:  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   The patient has a good understanding of the overall plan. she agrees with it. she will call with any problems that may develop before the next visit here.   Rulon Eisenmenger, MD 04/24/2015

## 2015-04-24 NOTE — Telephone Encounter (Signed)
Called pt to congratulate on completion of xrt. Discussed survivorship program and referral. Denies needs or questions at this time. Encourage pt to call with concerns. Received verbal understanding.

## 2015-04-24 NOTE — Telephone Encounter (Signed)
Appointments made and avs printed for patient °

## 2015-04-24 NOTE — Assessment & Plan Note (Signed)
Left breast invasive lobular cancer 2.5 cm in size status post lumpectomy showing invasive lobular cancer, grade 3, multifocal, 2.5 cm and 0.25 cm, margins negative, ER 0%, PR 0%, HER-2 -1/11 micro-met (0.3 cm) Adjuvant chemotherapy with Taxotere and Cytoxan every 3 weeks 4 cycles completed 03/03/2015 Adjuvant radiation therapy completed 04/24/2015  Treatment plan: Surveillance with breast exams and annual mammograms Return to clinic in 6 months for follow-up

## 2015-04-30 ENCOUNTER — Other Ambulatory Visit: Payer: Self-pay | Admitting: Adult Health

## 2015-04-30 ENCOUNTER — Ambulatory Visit: Payer: Medicare Other

## 2015-04-30 DIAGNOSIS — C50212 Malignant neoplasm of upper-inner quadrant of left female breast: Secondary | ICD-10-CM

## 2015-05-01 ENCOUNTER — Telehealth: Payer: Self-pay | Admitting: Hematology and Oncology

## 2015-05-01 NOTE — Telephone Encounter (Signed)
Called and left a message with survivorship anne °

## 2015-05-02 NOTE — Progress Notes (Signed)
  Radiation Oncology         [phone] ________________________________  Name: Annette Meadows MRN: [id number]  Date: 04/23/2015  DOB: Oct 11, 1940  End of Treatment Note  Diagnosis:   Left-sided breast cancer     Indication for treatment:  Curative       Radiation treatment dates:   03/26/2015 through 04/23/2015  Site/dose:   The patient initially received a dose of 42.5 Gy in 17 fractions to the breast using whole-breast tangent fields. This was delivered using a 3-D conformal technique. The patient then received a boost to the seroma. This delivered an additional 7.5 Gy in 3 fractions using an en face electron field due to the depth of the seroma. The total dose was 50 Gy.  Narrative: The patient tolerated radiation treatment relatively well.   The patient had some expected skin irritation as she progressed during treatment. Moist desquamation was not present at the end of treatment.  Plan: The patient has completed radiation treatment. The patient will return to radiation oncology clinic for routine followup in one month. I advised the patient to call or return sooner if they have any questions or concerns related to their recovery or treatment. ________________________________  Jodelle Gross, M.D., Ph.D.

## 2015-05-02 NOTE — Progress Notes (Signed)
  Radiation Oncology         [phone]) [phone] ________________________________  Name: Annette Meadows MRN: [id number]  Date: 04/02/2015  DOB: 02-16-1941  Complex simulation note  The patient has undergone complex simulation for her upcoming boost treatment for her diagnosis of breast cancer. The patient has initially been planned to receive 42.5 Gy. The patient will now receive a 7.5 Gy boost to the seroma cavity which has been contoured. This will be accomplished using an en face electron field. Based on the depth of the target area, 6 and 9 MeV electrons will be used. The patient's final total dose therefore will be 50 Gy. A complex isodose plan is requested for the boost treatment.   _______________________________  Jodelle Gross, MD, PhD

## 2015-05-02 NOTE — Progress Notes (Signed)
  Radiation Oncology         [phone]) [phone] ________________________________  Name: Annette Meadows MRN: [id number]  Date: 04/16/2015  DOB: [birth year]  Simulation Verification Note   NARRATIVE: The patient was brought to the treatment unit and placed in the planned treatment position. The clinical setup was verified.  The treatment beams were carefully compared against the planned radiation fields. The position, location, and shape of the radiation fields was reviewed. The targeted volume of tissue appears to be appropriately covered by the radiation beams. Based on my personal review, I approved the simulation verification. The patient's treatment will proceed as planned.  ________________________________   Jodelle Gross, MD, PhD

## 2015-05-29 ENCOUNTER — Ambulatory Visit
Admission: RE | Admit: 2015-05-29 | Discharge: 2015-05-29 | Disposition: A | Discharge status: Home / Self Care | Payer: Medicare Other | Source: Ambulatory Visit | Attending: Radiation Oncology | Admitting: Radiation Oncology

## 2015-05-29 ENCOUNTER — Telehealth: Payer: Self-pay | Admitting: *Deleted

## 2015-05-29 ENCOUNTER — Encounter: Payer: Self-pay | Admitting: Radiation Oncology

## 2015-05-29 VITALS — BP 145/57 | HR 73 | Temp 98.2°F | Resp 20 | Ht 66.0 in | Wt 204.1 lb

## 2015-05-29 DIAGNOSIS — C50212 Malignant neoplasm of upper-inner quadrant of left female breast: Secondary | ICD-10-CM

## 2015-05-29 NOTE — Progress Notes (Signed)
Follow up left breast radiation 03/26/15-04/23/15, breast well healed, still has tanning, , no pain stated, appetite good, completing amoxicillin  Tomorrow for a sinus infection,  10:27 AM BP 145/57 mmHg  Pulse 73  Temp(Src) 98.2 F (36.8 C) (Oral)  Resp 20  Ht 5\' 6"  (1.676 m)  Wt 204 lb 1.6 oz (92.579 kg)  BMI 32.96 kg/m2  SpO2 100%  Wt Readings from Last 3 Encounters:  05/29/15 204 lb 1.6 oz (92.579 kg)  04/24/15 194 lb 1.6 oz (88.043 kg)  04/15/15 200 lb (90.719 kg)

## 2015-05-29 NOTE — Telephone Encounter (Signed)
Called patient home, spoke with the daughter,asked if patient was there,her  appt was for 1000 am;  she stated "Mom is on the way to the hospital now", coming from [place], thanked the faughter 10:14 AM

## 2015-05-29 NOTE — Progress Notes (Signed)
Radiation Oncology         [phone]) [phone] ________________________________  Name: Annette Meadows MRN: [id number]  Date: 05/29/2015  DOB: Apr 08, 1941  Follow-Up Visit Note  CC: Pcp Not In System  Nicholas Lose, MD  Diagnosis:   Left-sided breast cancer  Interval Since Last Radiation:  Approximately one month   Narrative:  The patient returns today for routine follow-up.  She has done well overall since she finished treatment. The patient's skin has healed significantly since she completed her course of radiation treatment. She has not begun anti-hormonal treatment.    Follow up left breast radiation 03/26/15-04/23/15, breast well healed, still has tanning, , no pain stated, appetite good, completing amoxicillin  Tomorrow for a sinus infection,  10:44 AM BP 145/57 mmHg  Pulse 73  Temp(Src) 98.2 F (36.8 C) (Oral)  Resp 20  Ht 5\' 6"  (1.676 m)  Wt 204 lb 1.6 oz (92.579 kg)  BMI 32.96 kg/m2  SpO2 100%  Wt Readings from Last 3 Encounters:  05/29/15 204 lb 1.6 oz (92.579 kg)  04/24/15 194 lb 1.6 oz (88.043 kg)  04/15/15 200 lb (90.719 kg)                              ALLERGIES:  has no allergies on file.  Meds: Current Outpatient Prescriptions  Medication Sig Dispense Refill  . amLODipine (NORVASC) 10 MG tablet Take 10 mg by mouth daily.    Marland Kitchen amoxicillin (AMOXIL) 500 MG capsule Take 1,500 mg by mouth daily. Reported on 05/29/2015    . aspirin 81 MG tablet Take 81 mg by mouth daily.    Marland Kitchen atorvastatin (LIPITOR) 40 MG tablet Take 40 mg by mouth daily.    . carvedilol (COREG) 25 MG tablet Take 25 mg by mouth daily.    . Cetirizine HCl (ZYRTEC ALLERGY PO) Take 10 mg by mouth daily.    . Cholecalciferol (VITAMIN D) 2000 UNITS tablet Take 2,000 Units by mouth daily.    . cholestyramine light (PREVALITE) 4 G packet Take 4 g by mouth daily.    . Diclofenac Sodium (PENNSAID) 2 % SOLN Place 1 spray onto the skin 2 (two) times daily.    . diclofenac sodium (VOLTAREN) 1 % GEL Apply topically 2  (two) times daily.    Marland Kitchen esomeprazole (NEXIUM) 40 MG capsule Take 40 mg by mouth daily at 12 noon.    . fluticasone (FLONASE) 50 MCG/ACT nasal spray Place 1 spray into both nostrils 2 (two) times daily.    Marland Kitchen gabapentin (NEURONTIN) 300 MG capsule Take 300 mg by mouth 3 (three) times daily.    . hyaluronate sodium (RADIAPLEXRX) GEL Apply 1 application topically 2 (two) times daily. 1 Tube 1  . Insulin Glargine (LANTUS SOLOSTAR) 100 UNIT/ML Solostar Pen Inject 40 Units into the skin daily at 10 pm.    . levothyroxine (SYNTHROID, LEVOTHROID) 50 MCG tablet Take 50 mcg by mouth daily before breakfast.    . lisinopril (PRINIVIL,ZESTRIL) 40 MG tablet Take 40 mg by mouth daily.    Marland Kitchen LORazepam (ATIVAN) 0.5 MG tablet Take 1 tablet (0.5 mg total) by mouth at bedtime. 30 tablet 0  . LUMIGAN 0.01 % SOLN Place 1 drop into both eyes.    . montelukast (SINGULAIR) 10 MG tablet Take 10 mg by mouth at bedtime.    . non-metallic deodorant Jethro Poling) MISC Apply 1 application topically daily as needed.    Marland Kitchen oxyCODONE (OXY IR/ROXICODONE) 5  MG immediate release tablet     . oxyCODONE-acetaminophen (PERCOCET/ROXICET) 5-325 MG per tablet     . prochlorperazine (COMPAZINE) 10 MG tablet Take 1 tablet (10 mg total) by mouth every 6 (six) hours as needed (Nausea or vomiting). 30 tablet 1  . SitaGLIPtin-MetFORMIN HCl (JANUMET XR) 50-1000 MG TB24 Take 1,000 mg by mouth 2 (two) times daily.    . SURE COMFORT PEN NEEDLES 31G X 5 MM MISC     . traMADol (ULTRAM) 50 MG tablet Take by mouth every 6 (six) hours as needed.     No current facility-administered medications for this encounter.    Physical Findings: The patient is in no acute distress. Patient is alert and oriented.  height is 5\' 6"  (1.676 m) and weight is 204 lb 1.6 oz (92.579 kg). Her oral temperature is 98.2 F (36.8 C). Her blood pressure is 145/57 and her pulse is 73. Her respiration is 20 and oxygen saturation is 100%. .   The skin in the treatment area has healed  satisfactorily, no areas of concern/moist desquamation/poor healing  Lab Findings: Lab Results  Component Value Date   WBC 7.2 03/03/2015   HGB 9.2* 03/03/2015   HCT 30.2* 03/03/2015   MCV 86.0 03/03/2015   PLT 201 03/03/2015     Radiographic Findings: No results found.  Impression:    The patient has done satisfactorily since finishing treatment. She has not begun anti-hormonal treatment.  Plan:  The patient will followup in our clinic on a when necessary basis.   Jodelle Gross, M.D., Ph.D.

## 2015-07-16 ENCOUNTER — Encounter: Payer: Medicare Other | Admitting: Nurse Practitioner

## 2015-07-20 ENCOUNTER — Encounter: Payer: Self-pay | Admitting: Nurse Practitioner

## 2015-07-20 DIAGNOSIS — C50212 Malignant neoplasm of upper-inner quadrant of left female breast: Secondary | ICD-10-CM

## 2015-07-20 NOTE — Progress Notes (Signed)
The Survivorship Care Plan was mailed to Annette Meadows as she reported not being able to come in to the Survivorship Clinic for an in-person visit at this time. A letter was mailed to her outlining the purpose of the content of the care plan, as well as encouraging her to reach out to me with any questions or concerns.  My business card was included in the correspondence to the patient as well.    I will not be placing any follow-up appointments to the Survivorship Clinic for Annette Meadows, but I am happy to see her at any time in the future for any survivorship concerns that may arise. Thank you for allowing me to participate in her care!  Kenn File, Marie [phone]

## 2015-10-22 ENCOUNTER — Telehealth: Payer: Self-pay | Admitting: Hematology and Oncology

## 2015-10-22 NOTE — Telephone Encounter (Signed)
pt called to r/s appt...done....pt ok and aware of new d.t °

## 2015-10-23 ENCOUNTER — Ambulatory Visit: Payer: Medicare Other | Admitting: Hematology and Oncology

## 2015-10-30 ENCOUNTER — Telehealth: Payer: Self-pay | Admitting: Hematology and Oncology

## 2015-10-30 NOTE — Telephone Encounter (Signed)
Pt called to cxl 6/20 appt on  6/16 due to pt still in [place] under the care of her surgeon

## 2015-11-03 ENCOUNTER — Ambulatory Visit: Payer: Medicare Other | Admitting: Hematology and Oncology

## 2016-02-17 ENCOUNTER — Telehealth: Payer: Self-pay | Admitting: Hematology and Oncology

## 2016-02-17 NOTE — Telephone Encounter (Signed)
FAXED PT OFFICE NOTES TO [facility] ID [id number]

## 2016-03-16 ENCOUNTER — Non-Acute Institutional Stay (SKILLED_NURSING_FACILITY): Payer: Medicare Other | Admitting: Family

## 2016-03-16 ENCOUNTER — Encounter: Payer: Self-pay | Admitting: Family

## 2016-03-16 DIAGNOSIS — B370 Candidal stomatitis: Secondary | ICD-10-CM

## 2016-03-16 DIAGNOSIS — R531 Weakness: Secondary | ICD-10-CM | POA: Diagnosis not present

## 2016-03-16 DIAGNOSIS — I10 Essential (primary) hypertension: Secondary | ICD-10-CM | POA: Diagnosis not present

## 2016-03-16 NOTE — Progress Notes (Addendum)
Location:  [facility] Room Number: 702-P Place of Service:  SNF (31) Provider:  Marlowe Sax FNP-C   Patient Care Team: Pcp Not In System as PCP - General Nicholas Lose, MD as Consulting Physician (Hematology and Oncology) Kyung Rudd, MD as Consulting Physician (Radiation Oncology) Sylvan Cheese, NP as Nurse Practitioner (Hematology and Oncology)  Extended Emergency Contact Information Primary Emergency Contact: Lars Pinks, [place] Phone: [phone] Relation: Daughter  Code Status:  Full code  Goals of care: Advanced Directive information Advanced Directives 03/09/2015  Does patient have an advance directive? No  Would patient like information on creating an advanced directive? No - patient declined information     Chief Complaint  Patient presents with  . Acute Visit    Evaluation of medication    HPI:  Pt is a 75 y.o. female seen today at [facility]  for medical management of chronic diseases.She has a medical History of HTN, Hyperlipidemia, Breast Cancer, CKD stage 3  Among other conditions. She is seen in her room today. She states feeling tired just returned from traveling to oncologist appointment in [place]. She denies any acute issues.She was recently discharged to [facility] facility for STR on amlodipine 20 mg tablet. Unable to reach previous provider to determine exact medication dose. Patient unable to recall how much she has been taking states " I think it was 40 mg " she seems to be very forgetful during the visit.Per pharmacy amlodipine max dose is 10 mg Tablet. Patient's B/p has been stable since admission to facility.Discussed with patient and facility Nurse that plan to reduce dose for now then will monitor B/P. Patient agreed with plan.       Past Medical History:  Diagnosis Date  . Anemia    chronic  . Breast cancer ([place]) 11/10/14   initial bx  left breast 9 o'clock mass  . Chronic kidney disease    chronic kidney stage 3  . CTS (carpal tunnel syndrome)    B/L  . Diaphragmatic hernia   . Diverticulosis   . GERD (gastroesophageal reflux disease)   . Hiatal hernia   . Hyperlipidemia   . Hypertension   . IBS (irritable bowel syndrome)   . Myocardial infarction   . Osteoarthritis   . Raynaud phenomenon   . Thyroid disease    hypo   Past Surgical History:  Procedure Laterality Date  . APPENDECTOMY    . BREAST LUMPECTOMY WITH AXILLARY LYMPH NODE BIOPSY Left 12/08/14  . CHOLECYSTECTOMY      No Known Allergies    Medication List       Accurate as of 03/16/16 10:39 AM. Always use your most recent med list.          acetaminophen 500 MG tablet Commonly known as:  TYLENOL Take 500 mg by mouth every 6 (six) hours as needed for mild pain or moderate pain.   Acidophilus 100 MG Caps Take 1 capsule by mouth 2 (two) times daily. Stop date 03/18/16   aluminum-magnesium hydroxide-simethicone [id number] MG/5ML Susp Commonly known as:  MAALOX Take 15 mLs by mouth every 8 (eight) hours as needed.   amLODipine 10 MG tablet Commonly known as:  NORVASC Take 20 mg by mouth daily.   carvedilol 6.25 MG tablet Commonly known as:  COREG Take 6.25 mg by mouth 2 (two) times daily with a meal.  dextromethorphan 15 MG/5ML syrup Take 10 mLs by mouth every 6 (six) hours as needed for cough.   glipiZIDE 10 MG tablet Commonly known as:  GLUCOTROL Take 10 mg by mouth daily before breakfast.   levETIRAcetam 500 MG tablet Commonly known as:  KEPPRA Take 500 mg by mouth 2 (two) times daily.   loperamide 2 MG capsule Commonly known as:  IMODIUM Take 2 mg by mouth as needed for diarrhea or loose stools.   ondansetron 8 MG tablet Commonly known as:  ZOFRAN Take 8 mg by mouth every 8 (eight) hours as needed for nausea or vomiting.   oxyCODONE-acetaminophen 5-325 MG tablet Commonly known as:  PERCOCET/ROXICET Take 1-2 tablets by  mouth every 4 (four) hours as needed for moderate pain or severe pain.   zinc oxide 20 % ointment Apply 1 application topically 2 (two) times daily.       Review of Systems  Constitutional: Positive for fatigue. Negative for activity change, appetite change and fever.  HENT: Negative for congestion, rhinorrhea, sinus pressure, sneezing and sore throat.   Eyes: Negative.   Respiratory: Negative for cough, chest tightness, shortness of breath and wheezing.   Cardiovascular: Negative for chest pain, palpitations and leg swelling.  Gastrointestinal: Negative for abdominal distention, constipation, diarrhea, nausea and vomiting.  Genitourinary: Negative for dysuria, frequency and urgency.  Musculoskeletal: Positive for gait problem.  Skin: Negative for color change, pallor, rash and wound.  Neurological: Negative for dizziness, seizures, syncope, light-headedness and headaches.  Hematological: Does not bruise/bleed easily.  Psychiatric/Behavioral: Negative for agitation, confusion, hallucinations and sleep disturbance. The patient is not nervous/anxious.      There is no immunization history on file for this patient. Pertinent  Health Maintenance Due  Topic Date Due  . DEXA SCAN  03/07/2005  . PNA vac Low Risk Adult (1 of 2 - PCV13) 03/07/2005  . INFLUENZA VACCINE  12/15/2015   Fall Risk  03/09/2015 12/15/2014  Falls in the past year? No No      Vitals:   03/16/16 1018  BP: (!) 144/80  Resp: 19  Temp: 98.4 F (36.9 C)  TempSrc: Oral  SpO2: 96%  Weight: 155 lb 9.6 oz (70.6 kg)  Height: 5\' 6"  (1.676 m)   Body mass index is 25.11 kg/m. Physical Exam  Constitutional: She appears well-developed and well-nourished. No distress.  Forgetful  HENT:  Head: Normocephalic.  Mouth/Throat: No oropharyngeal exudate.  Whitish coating on tongue noted.   Eyes: Conjunctivae and EOM are normal. Pupils are equal, round, and reactive to light. Right eye exhibits no discharge. Left eye  exhibits no discharge. No scleral icterus.  Neck: Normal range of motion. Neck supple. No JVD present. No thyromegaly present.  Cardiovascular: Normal rate, regular rhythm, normal heart sounds and intact distal pulses.  Exam reveals no gallop and no friction rub.   No murmur heard. Pulmonary/Chest: Effort normal and breath sounds normal. No respiratory distress. She has no wheezes. She has no rales.  Abdominal: Soft. Bowel sounds are normal. She exhibits no distension. There is no tenderness. There is no rebound and no guarding.  Musculoskeletal: Normal range of motion. She exhibits no edema, tenderness or deformity.  Unsteady gait   Lymphadenopathy:    She has no cervical adenopathy.  Neurological:  Alert and oriented to person and place. Forgetful at times.    Skin: Skin is warm and dry. No rash noted. No erythema. No pallor.  Skin hyperpigmentation noted on forehead.   Psychiatric: She has a  normal mood and affect.    Assessment/Plan Oral Thrush  Whitish coating on tongue noted. Fluconazole 400 mg x 1 dose then 200 mg Tablet daily X 14 days per oncologist orders. Continue to monitor.   HTN B/p stable. Discharge to Rehab on amlodipine 20 mg tablet. Unable to reach previous provider to determine exact medication dose. Patient unable to recall how much she has been taking states " I think it was 40 mg " she seems to be very forgetful during the visit.Per pharmacy amlodipine max dose is 10 mg Tablet. Patient's B/p has been stable since admission to facility.Discussed with patient and facility Nurse that plan to reduce dose for now then will monitor B/P. Patient agreed with plan. Decrease Amlodipine to 10 mg Tablet daily. Monitor B/p and HR every shift x 1 week then resume previous orders.      Generalized weakness  Newly diagnosis of breast Cancer with mets to the brain. Seen by oncologist prior to visit. Continue PT/OT for ROM, exercise, gait stability and muscle strengthening. Fall and  safety precautions.    Family/ staff Communication: Reviewed plan of care with patient and facility Nurse supervisor.  Labs/tests ordered:  None

## 2016-03-21 ENCOUNTER — Encounter: Payer: Self-pay | Admitting: Internal Medicine

## 2016-03-21 ENCOUNTER — Non-Acute Institutional Stay (SKILLED_NURSING_FACILITY): Payer: Medicare Other | Admitting: Internal Medicine

## 2016-03-21 ENCOUNTER — Other Ambulatory Visit: Payer: Self-pay

## 2016-03-21 DIAGNOSIS — I25709 Atherosclerosis of coronary artery bypass graft(s), unspecified, with unspecified angina pectoris: Secondary | ICD-10-CM

## 2016-03-21 DIAGNOSIS — G40909 Epilepsy, unspecified, not intractable, without status epilepticus: Secondary | ICD-10-CM

## 2016-03-21 DIAGNOSIS — B370 Candidal stomatitis: Secondary | ICD-10-CM

## 2016-03-21 DIAGNOSIS — E1169 Type 2 diabetes mellitus with other specified complication: Secondary | ICD-10-CM | POA: Diagnosis not present

## 2016-03-21 DIAGNOSIS — C50912 Malignant neoplasm of unspecified site of left female breast: Secondary | ICD-10-CM

## 2016-03-21 DIAGNOSIS — R5381 Other malaise: Secondary | ICD-10-CM

## 2016-03-21 DIAGNOSIS — I10 Essential (primary) hypertension: Secondary | ICD-10-CM

## 2016-03-21 DIAGNOSIS — C799 Secondary malignant neoplasm of unspecified site: Secondary | ICD-10-CM

## 2016-03-21 DIAGNOSIS — I209 Angina pectoris, unspecified: Secondary | ICD-10-CM

## 2016-03-21 DIAGNOSIS — R110 Nausea: Secondary | ICD-10-CM | POA: Diagnosis not present

## 2016-03-21 DIAGNOSIS — R4189 Other symptoms and signs involving cognitive functions and awareness: Secondary | ICD-10-CM

## 2016-03-21 DIAGNOSIS — G8929 Other chronic pain: Secondary | ICD-10-CM

## 2016-03-21 DIAGNOSIS — E669 Obesity, unspecified: Secondary | ICD-10-CM

## 2016-03-21 DIAGNOSIS — M549 Dorsalgia, unspecified: Secondary | ICD-10-CM

## 2016-03-21 MED ORDER — OXYCODONE-ACETAMINOPHEN 5-325 MG PO TABS: ORAL_TABLET | ORAL | 0 refills | Status: DC

## 2016-03-21 NOTE — Progress Notes (Signed)
LOCATION:  [facility]  PCP: Pcp Not In System   Code Status: FULL CODE  Goals of care: Advanced Directive information Advanced Directives 03/09/2015  Does patient have an advance directive? No  Would patient like information on creating an advanced directive? No - patient declined information       Extended Emergency Contact Information Primary Emergency Contact: Thompson,Anita Address: [place], [place] Phone: [phone] Mobile Phone: [phone] Relation: Daughter Secondary Emergency Contact: Ria [place] of [place] Phone: [phone] Mobile Phone: [phone] Relation: Grandaughter   No Known Allergies  Chief Complaint  Patient presents with  . New Admit To SNF    New Admission Visit     HPI:  Patient is a 75 y.o. female seen today for short term care to undergo rehabilitation before returning home to live with her daughter. She was at [facility] from 02/06/16-02/23/16 and at another SNF in [place] from 02/23/16-03/07/16. 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 Plan is for her to undergo CT guided liver biopsy of liver on 03/30/16 and consider palliative chemotherapy. She was diagnosed with metastases to brain in September 2017 and is currently on keppra with no further seizures. She is seen in her room today.   Review of Systems: limited with minimal participation and answers some questions after repetitive questioning. Poor eye contact.  Constitutional: Negative for fever, chills  HENT: Negative for congestion, nasal discharge, sore throat, difficulty swallowing.  Positive for occasional headache Eyes: Negative for double vision and discharge.  Respiratory: Negative for cough, shortness of breath and wheezing.     Cardiovascular: Negative for chest pain, palpitations Gastrointestinal: Negative for heartburn, nausea, vomiting, abdominal pain. Does not remember having bowel movement Genitourinary: Negative for dysuria Musculoskeletal: Negative for fall per staff    Past Medical History:  Diagnosis Date  . Anemia    chronic  . Breast cancer ([place]) 11/10/14   initial bx left breast 9 o'clock mass  . Chronic kidney disease    chronic kidney stage 3  . CTS (carpal tunnel syndrome)    B/L  . Diaphragmatic hernia   . Diverticulosis   . GERD (gastroesophageal reflux disease)   . Hiatal hernia   . Hyperlipidemia   . Hypertension   . IBS (irritable bowel syndrome)   . Myocardial infarction   . Osteoarthritis   . Raynaud phenomenon   . Thyroid disease    hypo   Past Surgical History:  Procedure Laterality Date  . APPENDECTOMY    . BREAST LUMPECTOMY WITH AXILLARY LYMPH NODE BIOPSY Left 12/08/14  . CHOLECYSTECTOMY     Social History:   reports that she has never smoked. She has never used smokeless tobacco. She reports that she does not drink alcohol or use drugs.  Family History  Problem Relation Age of Onset  . Cancer Neg Hx     Medications:   Medication List       Accurate as of 03/21/16 12:30 PM. Always use your most recent med list.          acetaminophen 500 MG tablet Commonly known as:  TYLENOL Take 500 mg by mouth every 6 (six) hours as needed for mild pain or moderate pain.   aluminum-magnesium hydroxide-simethicone [id number] MG/5ML  Susp Commonly known as:  MAALOX Take 15 mLs by mouth every 8 (eight) hours as needed.   amLODipine 10 MG tablet Commonly known as:  NORVASC Take 10 mg by mouth daily.   carvedilol 6.25 MG tablet Commonly known as:  COREG Take 6.25 mg by mouth 2 (two) times daily with a meal.   dextromethorphan 15 MG/5ML syrup Take 10 mLs by mouth every 6 (six) hours as needed for cough.   glipiZIDE 10 MG tablet Commonly known as:  GLUCOTROL Take  10 mg by mouth daily before breakfast.   levETIRAcetam 500 MG tablet Commonly known as:  KEPPRA Take 500 mg by mouth 2 (two) times daily.   loperamide 2 MG capsule Commonly known as:  IMODIUM Take 2 mg by mouth as needed for diarrhea or loose stools.   magnesium hydroxide 800 MG/5ML suspension Commonly known as:  MILK OF MAGNESIA Take 30 mLs by mouth daily as needed for constipation.   nitroGLYCERIN 0.4 MG SL tablet Commonly known as:  NITROSTAT Place 0.4 mg under the tongue every 5 (five) minutes as needed for chest pain.   ondansetron 8 MG tablet Commonly known as:  ZOFRAN Take 8 mg by mouth every 8 (eight) hours as needed for nausea or vomiting.   oxyCODONE-acetaminophen 5-325 MG tablet Commonly known as:  PERCOCET/ROXICET Take 1-2 tablets by mouth every 4 (four) hours as needed for moderate pain or severe pain.   UNABLE TO FIND Med Name: Med pass 90 mL by mouth 2 times daily   zinc oxide 20 % ointment Apply 1 application topically 2 (two) times daily.       Immunizations:  There is no immunization history on file for this patient.   Physical Exam:  Vitals:   03/21/16 1212  BP: 124/76  Pulse: 60  Resp: 17  Temp: 97.8 F (36.6 C)  TempSrc: Oral  SpO2: 95%  Weight: 181 lb (82.1 kg)  Height: 5\' 5"  (1.651 m)   Body mass index is 30.12 kg/m.  General- elderly female, ill appearing, in no acute distress Head- normocephalic, atraumatic Nose- normal nasal mucosa, no maxillary or frontal sinus tenderness, no nasal discharge Throat- moist mucus membrane, oral thrush Eyes- PERRLA, EOMI, no pallor, no icterus, no discharge, normal conjunctiva, normal sclera Neck- no cervical lymphadenopathy, no jugular vein distension Cardiovascular- normal s1,s2, no murmur Respiratory- bilateral clear to auscultation, no wheeze, no rhonchi, no crackles, no use of accessory muscles Abdomen- bowel sounds present, soft, non tender Musculoskeletal- able to move all 4 extremities,  generalized weakness Neurological- slow processing but is alert and oriented to person and place but not to time Skin- warm and dry    Labs reviewed: Basic Metabolic Panel: No results for input(s): NA, K, CL, CO2, GLUCOSE, BUN, CREATININE, CALCIUM, MG, PHOS in the last 8760 hours. Liver Function Tests: No results for input(s): AST, ALT, ALKPHOS, BILITOT, PROT, ALBUMIN in the last 8760 hours. No results for input(s): LIPASE, AMYLASE in the last 8760 hours. No results for input(s): AMMONIA in the last 8760 hours. CBC: No results for input(s): WBC, NEUTROABS, HGB, HCT, MCV, PLT in the last 8760 hours. Cardiac Enzymes: No results for input(s): CKTOTAL, CKMB, CKMBINDEX, TROPONINI in the last 8760 hours. BNP: Invalid input(s): POCBNP CBG: No results for input(s): GLUCAP in the last 8760 hours.  Radiological Exams: Xr Chest Pa and Lateral 10/18.17 Impression. 3.7 cm left apical mass and additional suspected sub centimeter bilateral ling nodules   Assessment/Plan  Physical deconditioning Will have her work  with physical therapy and occupational therapy team to help with gait training and muscle strengthening exercises.fall precautions. Skin care. Encourage to be out of bed.   Stage 4 breast cancer with metastases To brain, lung and liver. She is to undergo CT guided liver biopsy on 03/30/16 by IR. Reviewed oncology note from [facility] 03/16/16. 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 SLP consult to follow on safety awareness  Oral thrush Continue and complete total 2 weeks of fluconazole 100 mg daily.   Seizure disorder With her brain mets. Continue keppra 500 mg bid. Seizure precautions  Nausea No vomiting, continue prn zofran and monitor  HTN Monitor bp, continue coreg 6.25 mg bid and amlodipine 10 mg daily  DM type 2 No results found for: HGBA1C  Check a1c. Continue glipizide 10 mg daily. If a1c <7, d/c oral hypoglycemic  CAD Chest pain free.  Continue prn NTG with b blocker  Chronic back pain Currently on tylenol as needed and percocet 5-325 mg 1-2 tab q4h prn pain, monitor, avoid sedation    Goals of care: short term rehabilitation   Labs/tests ordered: cbc, cmp, a1c 03/22/16  Family/ staff Communication: reviewed care plan with patient and nursing supervisor    Blanchie Serve, MD Internal Medicine [place], [place] [address] Cell Phone (Monday-Friday 8 am - 5 pm): [phone] On Call: [phone] and follow prompts after 5 pm and on weekends Office Phone: [phone] Office Fax: [phone]

## 2016-03-21 NOTE — Telephone Encounter (Signed)
Rx faxed to [facility] @ [phone], phone number [phone]  

## 2016-03-22 LAB — CBC AND DIFFERENTIAL
HCT: 32 % — AB (ref 36–46)
HEMATOCRIT: 32 % — AB (ref 36–46)
HEMOGLOBIN: 9.9 g/dL — AB (ref 12.0–16.0)
HEMOGLOBIN: 9.9 g/dL — AB (ref 12.0–16.0)
Platelets: 224 10*3/uL (ref 150–399)
Platelets: 224 10*3/uL (ref 150–399)
WBC: 4.5 10*3/mL
WBC: 4.5 10^3/mL

## 2016-03-22 LAB — HEPATIC FUNCTION PANEL
ALT: 7 U/L (ref 7–35)
AST: 14 U/L (ref 13–35)
Alkaline Phosphatase: 72 U/L (ref 25–125)
Bilirubin, Total: 0.3 mg/dL

## 2016-03-22 LAB — BASIC METABOLIC PANEL
BUN: 17 mg/dL (ref 4–21)
Creatinine: 0.8 mg/dL (ref 0.5–1.1)
GLUCOSE: 147 mg/dL
POTASSIUM: 4 mmol/L (ref 3.4–5.3)
SODIUM: 140 mmol/L (ref 137–147)

## 2016-03-22 LAB — HEMOGLOBIN A1C: Hemoglobin A1C: 8.5

## 2016-03-24 ENCOUNTER — Non-Acute Institutional Stay (SKILLED_NURSING_FACILITY): Payer: Medicare Other | Admitting: Family

## 2016-03-24 DIAGNOSIS — E1165 Type 2 diabetes mellitus with hyperglycemia: Secondary | ICD-10-CM | POA: Insufficient documentation

## 2016-03-24 DIAGNOSIS — R05 Cough: Secondary | ICD-10-CM | POA: Diagnosis not present

## 2016-03-24 DIAGNOSIS — E1100 Type 2 diabetes mellitus with hyperosmolarity without nonketotic hyperglycemic-hyperosmolar coma (NKHHC): Secondary | ICD-10-CM | POA: Diagnosis not present

## 2016-03-24 DIAGNOSIS — R638 Other symptoms and signs concerning food and fluid intake: Secondary | ICD-10-CM

## 2016-03-24 DIAGNOSIS — D638 Anemia in other chronic diseases classified elsewhere: Secondary | ICD-10-CM

## 2016-03-24 DIAGNOSIS — IMO0002 Reserved for concepts with insufficient information to code with codable children: Secondary | ICD-10-CM | POA: Insufficient documentation

## 2016-03-24 DIAGNOSIS — R059 Cough, unspecified: Secondary | ICD-10-CM

## 2016-03-24 DIAGNOSIS — E440 Moderate protein-calorie malnutrition: Secondary | ICD-10-CM

## 2016-03-24 NOTE — Progress Notes (Signed)
Location:  [facility] Room Number: [place] of Service:  SNF (31) Provider: Ryett Hamman FNP-C   Pcp Not In System  Patient Care Team: Pcp Not In System as PCP - General Nicholas Lose, MD as Consulting Physician (Hematology and Oncology) Kyung Rudd, MD as Consulting Physician (Radiation Oncology) Sylvan Cheese, NP as Nurse Practitioner (Hematology and Oncology)  Extended Emergency Contact Information Primary Emergency Contact: Thompson,Anita Address: [place], [place] Phone: [phone] Mobile Phone: [phone] Relation: Daughter Secondary Emergency Contact: Ria Clock [place] Phone: [phone] Mobile Phone: [phone] Relation: Grandaughter  Code Status:  Full Code  Goals of care: Advanced Directive information Advanced Directives 03/09/2015  Does patient have an advance directive? No  Would patient like information on creating an advanced directive? No - patient declined information     Chief Complaint  Patient presents with  . Acute Visit    abnormal lab results    HPI:  Pt is a 75 y.o. female seen today at [facility]  for an acute visit for evaluation of abnormal lab results. She has a medical history of type 2 DM, Breast Cancer with brain mets ( Has appointment with oncologist 03/30/2016), Seizures, HTN among other conditions.She is seen in her room today. She denies any acute issues though answers simple short questions otherwise quiet most of the visit. Facility staff reports decreased communication, oral intake and refuses medications. Facility staff also reports patient restless at times. Recent lab results showed Hgb 9.9, HCT 32.3, Hgb A1C 8.5, Glucose 147, Alb 3.18 ( 03/22/2016). Speech Therapist reports patient coughing whenever she drinks thin liquids. Diet changed and Thickened liquids ordered.CBG's ranging in  AM 180's-200's and PM 110's-300's.       Past Medical History:  Diagnosis Date  . Anemia    chronic  . Breast cancer ([place]) 11/10/14   initial bx left breast 9 o'clock mass  . Chronic kidney disease    chronic kidney stage 3  . CTS (carpal tunnel syndrome)    B/L  . Diaphragmatic hernia   . Diverticulosis   . GERD (gastroesophageal reflux disease)   . Hiatal hernia   . Hyperlipidemia   . Hypertension   . IBS (irritable bowel syndrome)   . Myocardial infarction   . Osteoarthritis   . Raynaud phenomenon   . Thyroid disease    hypo   Past Surgical History:  Procedure Laterality Date  . APPENDECTOMY    . BREAST LUMPECTOMY WITH AXILLARY LYMPH NODE BIOPSY Left 12/08/14  . CHOLECYSTECTOMY      No Known Allergies    Medication List       Accurate as of 03/24/16  5:53 PM. Always use your most recent med list.          acetaminophen 500 MG tablet Commonly known as:  TYLENOL Take 500 mg by mouth every 6 (six) hours as needed for mild pain or moderate pain.   aluminum-magnesium hydroxide-simethicone [id number] MG/5ML Susp Commonly known as:  MAALOX Take 15 mLs by mouth every 8 (eight) hours as needed.   amLODipine 10 MG tablet Commonly known as:  NORVASC Take 10 mg by mouth daily.   carvedilol 6.25 MG tablet Commonly known as:  COREG Take 6.25 mg by mouth 2 (two) times daily with a meal.   dextromethorphan 15 MG/5ML syrup Take 10 mLs by mouth  every 6 (six) hours as needed for cough.   glipiZIDE 10 MG tablet Commonly known as:  GLUCOTROL Take 10 mg by mouth daily before breakfast.   levETIRAcetam 500 MG tablet Commonly known as:  KEPPRA Take 500 mg by mouth 2 (two) times daily.   loperamide 2 MG capsule Commonly known as:  IMODIUM Take 2 mg by mouth as needed for diarrhea or loose stools.   magnesium hydroxide 800 MG/5ML suspension Commonly known as:  MILK OF MAGNESIA Take 30 mLs by mouth daily as needed for constipation.   nitroGLYCERIN 0.4 MG SL  tablet Commonly known as:  NITROSTAT Place 0.4 mg under the tongue every 5 (five) minutes as needed for chest pain.   ondansetron 8 MG tablet Commonly known as:  ZOFRAN Take 8 mg by mouth every 8 (eight) hours as needed for nausea or vomiting.   oxyCODONE-acetaminophen 5-325 MG tablet Commonly known as:  PERCOCET/ROXICET 1 by mouth every 4 hours as needed for moderate pain, 2 by mouth every 4 hours as needed for severe pain   UNABLE TO FIND Med Name: Med pass 90 mL by mouth 2 times daily   zinc oxide 20 % ointment Apply 1 application topically 2 (two) times daily.       Review of Systems  Constitutional: Positive for appetite change and fatigue. Negative for activity change, chills and fever.  HENT: Negative for congestion, rhinorrhea, sinus pressure, sneezing and sore throat.   Eyes: Negative.   Respiratory: Negative for cough, chest tightness, shortness of breath and wheezing.   Cardiovascular: Negative for chest pain, palpitations and leg swelling.  Gastrointestinal: Negative for abdominal distention, constipation, diarrhea, nausea and vomiting.  Endocrine: Negative for polydipsia, polyphagia and polyuria.  Genitourinary: Negative for dysuria, frequency and urgency.  Musculoskeletal: Positive for gait problem.  Skin: Negative for color change, pallor, rash and wound.  Neurological: Negative for dizziness, seizures, syncope, light-headedness and headaches.  Hematological: Does not bruise/bleed easily.  Psychiatric/Behavioral: Negative for agitation, confusion, hallucinations and sleep disturbance. The patient is not nervous/anxious.      There is no immunization history on file for this patient. Pertinent  Health Maintenance Due  Topic Date Due  . HEMOGLOBIN A1C  09/03/1939  . FOOT EXAM  03/07/1950  . OPHTHALMOLOGY EXAM  03/07/1950  . URINE MICROALBUMIN  03/07/1950  . DEXA SCAN  03/07/2005  . PNA vac Low Risk Adult (1 of 2 - PCV13) 03/07/2005  . INFLUENZA VACCINE   12/15/2015   Fall Risk  03/09/2015 12/15/2014  Falls in the past year? No No      Vitals:   03/24/16 1145  BP: (!) 156/72  Pulse: 79  Resp: 18  Temp: 98.1 F (36.7 C)  SpO2: 94%  Weight: 181 lb (82.1 kg)  Height: 5\' 5"  (1.651 m)   Body mass index is 30.12 kg/m. Physical Exam  Constitutional: She appears well-developed and well-nourished. No distress.  Quiet during visit though follows commands.   HENT:  Head: Normocephalic.  Mouth/Throat: No oropharyngeal exudate.     Eyes: Conjunctivae and EOM are normal. Pupils are equal, round, and reactive to light. Right eye exhibits no discharge. Left eye exhibits no discharge. No scleral icterus.  Neck: Normal range of motion. Neck supple. No JVD present. No thyromegaly present.  Cardiovascular: Normal rate, regular rhythm, normal heart sounds and intact distal pulses.  Exam reveals no gallop and no friction rub.   No murmur heard. Pulmonary/Chest: Effort normal and breath sounds normal. No respiratory distress. She  has no wheezes. She has no rales.  Abdominal: Soft. Bowel sounds are normal. She exhibits no distension. There is no tenderness. There is no rebound and no guarding.  Genitourinary:  Genitourinary Comments: Incontinent.   Musculoskeletal: Normal range of motion. She exhibits no edema, tenderness or deformity.  Uses wheelchair with assistance.   Lymphadenopathy:    She has no cervical adenopathy.  Neurological: She is alert.      Skin: Skin is warm and dry. No rash noted. No erythema. No pallor.  Skin hyperpigmentation noted on forehead.   Psychiatric: She has a normal mood and affect.    Labs reviewed:  Recent Labs  03/22/16  NA 140  K 4.0  BUN 17  CREATININE 0.8    Recent Labs  03/22/16  AST 14  ALT 7  ALKPHOS 72    Recent Labs  03/22/16  WBC 4.5  HGB 9.9*  HCT 32*  PLT 224   No results found for: TSH Lab Results  Component Value Date   HGBA1C 8.5 03/22/2016   Assessment/Plan 1.  Uncontrolled type 2 diabetes mellitus with hyperosmolarity without coma, unspecified long term insulin use status (HCC) Recent Hgb A1C 8.5 ( 03/22/2016). CBG's ranging in AM 180's-200's and PM 110's-300's. Continue Humalog per SSI and Glipizide 10 mg tablet. Add Lantus 100 units/ml vial inject 8 units SQ at bedtime. Monitor Hgb A1C every 3-6 months.      2. Anemia, chronic disease Hgb 9.9, HCT 32.3 ( 03/22/2016) previous 9.2.continue to monitor for now given dx breast cancer with mets. Recheck CBC 03/28/2016.   3. Moderate protein-calorie malnutrition (HCC)  Alb 3.18 ( 03/22/2016).continue on protein supplements. RD to continue to monitor. BMP 03/28/2016.   4. Decreased oral intake Evaluated by speech therapy for swallowing.coughing with thin liquids. Thicken liquids ordered and food texture modified. Continue to assist with feeding. Monitor intake.   5. Cough Coughing with thin liquids intake.Evaluated by speech therapist as above. Thicken liquids ordered. Will obtain CXR PA/Lat r/o aspiration pneumonia. Monitor vital signs every shift x 1 week then resume previous ordered.  CBC 03/28/2016.    Family/ staff Communication: Reviewed plan of care with patient and facility Nurse supervisor.   Labs/tests ordered:   CBC, BMP 03/28/2016.

## 2016-03-28 ENCOUNTER — Encounter: Payer: Self-pay | Admitting: Internal Medicine

## 2016-03-28 ENCOUNTER — Non-Acute Institutional Stay (SKILLED_NURSING_FACILITY): Payer: Medicare Other | Admitting: Internal Medicine

## 2016-03-28 DIAGNOSIS — I10 Essential (primary) hypertension: Secondary | ICD-10-CM

## 2016-03-28 DIAGNOSIS — R638 Other symptoms and signs concerning food and fluid intake: Secondary | ICD-10-CM

## 2016-03-28 DIAGNOSIS — R627 Adult failure to thrive: Secondary | ICD-10-CM | POA: Diagnosis not present

## 2016-03-28 DIAGNOSIS — G894 Chronic pain syndrome: Secondary | ICD-10-CM | POA: Diagnosis not present

## 2016-03-28 DIAGNOSIS — E1101 Type 2 diabetes mellitus with hyperosmolarity with coma: Secondary | ICD-10-CM | POA: Diagnosis not present

## 2016-03-28 NOTE — Progress Notes (Signed)
LOCATION:  [facility]  PCP: Pcp Not In System   Code Status: FULL CODE  Goals of care: Advanced Directive information Advanced Directives 03/09/2015  Does patient have an advance directive? No  Would patient like information on creating an advanced directive? No - patient declined information       Extended Emergency Contact Information Primary Emergency Contact: Thompson,Anita Address: [place], [place] Phone: [phone] Mobile Phone: [phone] Relation: Daughter Secondary Emergency Contact: Ria Clock [place] of [place] Phone: [phone] Mobile Phone: [phone] Relation: Grandaughter   No Known Allergies  Chief Complaint  Patient presents with  . Acute Visit    Ongoing decline, poor oral intake and uncontrolled pain     HPI:  Patient is a 75 y.o. female seen today for acute visit for ongoing decline. She has poor oral intake. She has ongoing pain. Her BP reading has been elevated. She is seen in her room today. She has medical history of stage 4 breast cancer with metastases to her liver, left upper lobe of lung and brain.   Review of Systems: unable to obtain.    Past Medical History:  Diagnosis Date  . Anemia    chronic  . Breast cancer ([place]) 11/10/14   initial bx left breast 9 o'clock mass  . Chronic kidney disease    chronic kidney stage 3  . CTS (carpal tunnel syndrome)    B/L  . Diaphragmatic hernia   . Diverticulosis   . GERD (gastroesophageal reflux disease)   . Hiatal hernia   . Hyperlipidemia   . Hypertension   . IBS (irritable bowel syndrome)   . Myocardial infarction   . Osteoarthritis   . Raynaud phenomenon   . Thyroid disease    hypo   Past Surgical History:  Procedure Laterality Date  . APPENDECTOMY    . BREAST LUMPECTOMY WITH AXILLARY LYMPH NODE BIOPSY Left 12/08/14  . CHOLECYSTECTOMY      Medications:   Medication List         Accurate as of 03/28/16 12:50 PM. Always use your most recent med list.          acetaminophen 500 MG tablet Commonly known as:  TYLENOL Take 500 mg by mouth every 6 (six) hours as needed for mild pain or moderate pain.   aluminum-magnesium hydroxide-simethicone I037812 MG/5ML Susp Commonly known as:  MAALOX Take 15 mLs by mouth every 8 (eight) hours as needed.   amLODipine 10 MG tablet Commonly known as:  NORVASC Take 10 mg by mouth daily.   carvedilol 6.25 MG tablet Commonly known as:  COREG Take 6.25 mg by mouth 2 (two) times daily with a meal.   dextromethorphan 15 MG/5ML syrup Take 10 mLs by mouth every 6 (six) hours as needed for cough.   fluconazole 100 MG tablet Commonly known as:  DIFLUCAN Take 100 mg by mouth daily. Stop date 03/29/16   glipiZIDE 10 MG tablet Commonly known as:  GLUCOTROL Take 10 mg by mouth daily before breakfast.   insulin glargine 100 UNIT/ML injection Commonly known as:  LANTUS Inject 8 Units into the skin at bedtime.   levETIRAcetam 500 MG tablet Commonly known as:  KEPPRA Take 500 mg by mouth 2 (two) times daily.   loperamide 2 MG capsule Commonly known as:  IMODIUM Take 2 mg by mouth as needed for diarrhea or loose stools.   magnesium  hydroxide 800 MG/5ML suspension Commonly known as:  MILK OF MAGNESIA Take 30 mLs by mouth daily as needed for constipation.   nitroGLYCERIN 0.4 MG SL tablet Commonly known as:  NITROSTAT Place 0.4 mg under the tongue every 5 (five) minutes as needed for chest pain.   ondansetron 8 MG tablet Commonly known as:  ZOFRAN Take 8 mg by mouth every 8 (eight) hours as needed for nausea or vomiting.   oxyCODONE-acetaminophen 5-325 MG tablet Commonly known as:  PERCOCET/ROXICET Take 1-2 tablets by mouth every 4 (four) hours as needed for moderate pain or severe pain.   UNABLE TO FIND Med Name: Med pass 90 mL by mouth 2 times daily   zinc oxide 20 % ointment Apply 1 application topically 2 (two)  times daily.       Immunizations:  There is no immunization history on file for this patient.   Physical Exam:  Vitals:   03/28/16 1241  BP: (!) 169/87  Pulse: 88  Resp: (!) 22  Temp: 98.5 F (36.9 C)  TempSrc: Oral  SpO2: 96%  Weight: 181 lb (82.1 kg)  Height: 5\' 5"  (1.651 m)   Body mass index is 30.12 kg/m.  General- elderly female, ill appearing, in no acute distress Head- normocephalic, atraumatic Nose- no nasal discharge Throat- moist mucus membrane Eyes- no pallor, no icterus, no discharge, normal conjunctiva, normal sclera Neck- no cervical lymphadenopathy Cardiovascular- normal s1,s2, no murmur Respiratory- bilateral clear to auscultation, no wheeze, no rhonchi, no crackles, no use of accessory muscles Abdomen- bowel sounds present, soft, non tender Musculoskeletal- generalized weakness Skin- warm and dry    Labs reviewed: Basic Metabolic Panel:  Recent Labs  03/22/16  NA 140  K 4.0  BUN 17  CREATININE 0.8   Liver Function Tests:  Recent Labs  03/22/16  AST 14  ALT 7  ALKPHOS 72   No results for input(s): LIPASE, AMYLASE in the last 8760 hours. No results for input(s): AMMONIA in the last 8760 hours. CBC:  Recent Labs  03/22/16  WBC 4.5  HGB 9.9*  HCT 32*  PLT 224   Cardiac Enzymes: No results for input(s): CKTOTAL, CKMB, CKMBINDEX, TROPONINI in the last 8760 hours. BNP: Invalid input(s): POCBNP CBG: No results for input(s): GLUCAP in the last 8760 hours.  Radiological Exams: Xr Chest Pa and Lateral 10/18.17 Impression. 3.7 cm left apical mass and additional suspected sub centimeter bilateral ling nodules   Assessment/Plan  Failure to thrive With decreased oral intake, pain and severe deconditioning. Reviewed goals of care with her daughter and therapy team with social worker present. will focus on comfort care. Get palliative care consult.  Decreased oral intake With failure to thrive. Decline anticipated  chronic  pain Ongoing pain with worsening with beast cancer with metastases. Currently on percocet 5-325 mg 1-2 tab q4h prn pain. Discontinue this and start roxanol 20 mg/ml 0.25 ml/5 mg q4h prn pain sublingual. Palliative care consult  HTN Elevated blood pressure likely from pain. Currently on amlodipine 10 mg daily and coreg 6.25 mg bid. Monitor BP reading. Pain med changes as above  Dm type 2 Lab Results  Component Value Date   HGBA1C 8.5 03/22/2016   cbg reviewed mostly in 200-300s. Given poor po intake, discontinue glipizide to avoid hypoglycemia. To provide lantus 8 u and SSI novolog only if cbg >200. Monitor cbg.    Family/ staff Communication: reviewed care plan with patient's daughter, therapy team and nursing supervisor    Blanchie Serve, MD Internal  [facility] [place], [place] [address] Cell Phone (Monday-Friday 8 am - 5 pm): [phone] On Call: [phone] and follow prompts after 5 pm and on weekends Office Phone: [phone] Office Fax: [phone]

## 2016-03-30 ENCOUNTER — Other Ambulatory Visit: Payer: Self-pay | Admitting: *Deleted

## 2016-03-30 MED ORDER — LORAZEPAM 0.5 MG PO TABS: ORAL_TABLET | ORAL | 0 refills | Status: AC

## 2016-03-30 NOTE — Telephone Encounter (Signed)
[facility] [phone] Fax: [phone]  

## 2016-04-04 ENCOUNTER — Other Ambulatory Visit: Payer: Self-pay

## 2016-04-04 MED ORDER — FENTANYL 50 MCG/HR TD PT72
50.0000 ug | MEDICATED_PATCH | TRANSDERMAL | 0 refills | Status: AC
Start: 2016-04-04 — End: ?

## 2016-04-04 NOTE — Telephone Encounter (Signed)
Prescription request was received from:  [facility] [address] [place] [address]  Phone: [phone]  Fax: [phone]  

## 2016-04-06 ENCOUNTER — Non-Acute Institutional Stay (SKILLED_NURSING_FACILITY): Payer: Medicare Other | Admitting: Family

## 2016-04-06 DIAGNOSIS — G8929 Other chronic pain: Secondary | ICD-10-CM | POA: Diagnosis not present

## 2016-04-06 DIAGNOSIS — R569 Unspecified convulsions: Secondary | ICD-10-CM | POA: Diagnosis not present

## 2016-04-06 DIAGNOSIS — C50212 Malignant neoplasm of upper-inner quadrant of left female breast: Secondary | ICD-10-CM | POA: Diagnosis not present

## 2016-04-06 DIAGNOSIS — I10 Essential (primary) hypertension: Secondary | ICD-10-CM

## 2016-04-06 DIAGNOSIS — E1101 Type 2 diabetes mellitus with hyperosmolarity with coma: Secondary | ICD-10-CM

## 2016-04-06 DIAGNOSIS — F29 Unspecified psychosis not due to a substance or known physiological condition: Secondary | ICD-10-CM | POA: Diagnosis not present

## 2016-04-06 DIAGNOSIS — F411 Generalized anxiety disorder: Secondary | ICD-10-CM

## 2016-04-06 DIAGNOSIS — R5381 Other malaise: Secondary | ICD-10-CM | POA: Diagnosis not present

## 2016-04-06 DIAGNOSIS — E440 Moderate protein-calorie malnutrition: Secondary | ICD-10-CM

## 2016-04-06 DIAGNOSIS — R52 Pain, unspecified: Secondary | ICD-10-CM | POA: Diagnosis not present

## 2016-04-06 NOTE — Progress Notes (Signed)
Location:  [facility] Room Number: [place] of Service:  SNF (31)  Provider: Marlowe Sax FNP-C   PCP: Pcp Not In System Patient Care Team: Pcp Not In System as PCP - General Nicholas Lose, MD as Consulting Physician (Hematology and Oncology) Kyung Rudd, MD as Consulting Physician (Radiation Oncology) Sylvan Cheese, NP as Nurse Practitioner (Hematology and Oncology)  Extended Emergency Contact Information Primary Emergency Contact: Thompson,Anita Address: [place], [place] Phone: [phone] Mobile Phone: [phone] Relation: Daughter Secondary Emergency Contact: Ria Clock [place] Phone: [phone] Mobile Phone: [phone] Relation: Grandaughter  Code Status: DNR  Goals of care:  Advanced Directive information Advanced Directives 03/09/2015  Does Patient Have a Medical Advance Directive? No  Would patient like information on creating a medical advance directive? No - patient declined information     No Known Allergies  Chief Complaint  Patient presents with  . Discharge Note    discharge home     HPI:  75 y.o. female  Seen today at [facility] for discharge home.She was here for short term rehabilitation post hospital admission  [facility] from 02/06/16-02/23/16. She was at another SNF in [place] from 02/23/16-03/07/16. 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 She underwent CT guided liver biopsy of liver on 03/30/16 awaiting results.Plan was to consider palliative chemotherapy. She was diagnosed with metastases to brain in September 2017 and is currently on keppra with no further seizures. She is seen in her room today. She has progressive declined in condition with  increased confusion and decreased oral intake. She worked with PT/OT but skilled service discontinues due to patient's decline in condition impairing participation in exercise, ROM, gait stability and muscle strengthening. She is currently on Palliative care. Patient's POA desires to discharge home with community Home [facility] and Hospice service. She will require DME: standard WC with Cushion, anti tippers, extended brake handles, removable elevating leg rests  to enable her to maintain current level of independence.She will also require a FWW for short distances to enable her to perform ADL's where WC cannot be used. She will need a bedside commode to enable her to safely transfer independently for toileting.She will also require a semi [facility] bed  with  rails to enable repositioning in bed due to pain. She will also need a shower chair to enable her to take showers safely without falls. Home health services will be arranged by facility social worker prior to discharge. Prescription medication will be written x 1 month then patient to follow up with PCP in 1-2 weeks. Facility staff report no new concerns.     Past Medical History:  Diagnosis Date  . Anemia    chronic  . Breast cancer ([place]) 11/10/14   initial bx left breast 9 o'clock mass  . Chronic kidney disease    chronic kidney stage 3  . CTS (carpal tunnel syndrome)    B/L  . Diaphragmatic hernia   . Diverticulosis   . GERD (gastroesophageal reflux disease)   . Hiatal hernia   . Hyperlipidemia   . Hypertension   . IBS (irritable bowel syndrome)   . Myocardial infarction   . Osteoarthritis   . Raynaud phenomenon   .  Thyroid disease    hypo    Past Surgical History:  Procedure Laterality Date  . APPENDECTOMY    . BREAST LUMPECTOMY WITH AXILLARY LYMPH NODE BIOPSY Left 12/08/14  . CHOLECYSTECTOMY        reports that she has never smoked. She has never used smokeless tobacco. She reports that she does not drink alcohol or use  drugs. Social History   Social History  . Marital status: Widowed    Spouse name: N/A  . Number of children: N/A  . Years of education: N/A   Occupational History  . Not on file.   Social History Main Topics  . Smoking status: Never Smoker  . Smokeless tobacco: Never Used  . Alcohol use No  . Drug use: No  . Sexual activity: No   Other Topics Concern  . Not on file   Social History Narrative  . No narrative on file      No Known Allergies  Pertinent  Health Maintenance Due  Topic Date Due  . FOOT EXAM  03/07/1951  . OPHTHALMOLOGY EXAM  03/07/1951  . URINE MICROALBUMIN  03/07/1951  . DEXA SCAN  03/07/2005  . PNA vac Low Risk Adult (1 of 2 - PCV13) 03/07/2005  . INFLUENZA VACCINE  12/15/2015  . HEMOGLOBIN A1C  09/19/2016    Medications:   Medication List       Accurate as of 04/06/16 11:59 PM. Always use your most recent med list.          acetaminophen 500 MG tablet Commonly known as:  TYLENOL Take 500 mg by mouth every 6 (six) hours as needed for mild pain or moderate pain.   aluminum-magnesium hydroxide-simethicone [id number] MG/5ML Susp Commonly known as:  MAALOX Take 15 mLs by mouth every 8 (eight) hours as needed.   amLODipine 10 MG tablet Commonly known as:  NORVASC Take 10 mg by mouth daily.   carvedilol 6.25 MG tablet Commonly known as:  COREG Take 6.25 mg by mouth 2 (two) times daily with a meal.   dexamethasone 2 MG tablet Commonly known as:  DECADRON Take 2 mg by mouth 2 (two) times daily with a meal.   dextromethorphan 15 MG/5ML syrup Take 10 mLs by mouth every 6 (six) hours as needed for cough.   fentaNYL 50 MCG/HR Commonly known as:  DURAGESIC - dosed mcg/hr Place 1 patch (50 mcg total) onto the skin every 3 (three) days. Remove old patch. External use only. Rotate sites. Check placement of patch every shift.   insulin glargine 100 UNIT/ML injection Commonly known as:  LANTUS Inject 8 Units into the skin at bedtime.     insulin lispro 100 UNIT/ML injection Commonly known as:  HUMALOG Inject into the skin 3 (three) times daily before meals. Per SSI CBG's 201-250 =5 units; 251-350= 8 units; 351-400 = 10 units> 400 notify provider.   levETIRAcetam 500 MG tablet Commonly known as:  KEPPRA Take 500 mg by mouth 2 (two) times daily.   loperamide 2 MG capsule Commonly known as:  IMODIUM Take 2 mg by mouth as needed for diarrhea or loose stools.   LORazepam 0.5 MG tablet Commonly known as:  ATIVAN Take one tablet by mouth every 8 hours as needed for anxiety   magnesium hydroxide 800 MG/5ML suspension Commonly known as:  MILK OF MAGNESIA Take 30 mLs by mouth daily as needed for constipation.   morphine 20 MG/5ML solution Take 10 mg by mouth every 2 (two) hours as needed for  pain.   nitroGLYCERIN 0.4 MG SL tablet Commonly known as:  NITROSTAT Place 0.4 mg under the tongue every 5 (five) minutes as needed for chest pain.   ondansetron 8 MG tablet Commonly known as:  ZOFRAN Take 8 mg by mouth every 8 (eight) hours as needed for nausea or vomiting.   QUEtiapine 50 MG tablet Commonly known as:  SEROQUEL Take 25 mg by mouth at bedtime. pyschosis   UNABLE TO FIND Med Name: Med pass 90 mL by mouth 2 times daily   zinc oxide 20 % ointment Apply 1 application topically 2 (two) times daily.       Review of Systems  Unable to perform ROS: Mental status change    Vitals:   04/06/16 1030  BP: 134/78  Pulse: 94  Resp: 20  Temp: (!) 96.8 F (36 C)  SpO2: 93%  Weight: 167 lb 6.4 oz (75.9 kg)  Height: 5\' 5"  (1.651 m)   Body mass index is 27.86 kg/m. Physical Exam  Constitutional: She appears well-developed and well-nourished.  Restless in bed constantly moaning and yelling out loudly but does not verbalize needs. Pain medication administered by facility Nurse.Fentanyl Patch in place.   HENT:  Head: Normocephalic.  Mouth/Throat: Oropharynx is clear and moist. No oropharyngeal exudate.  Eyes:  Conjunctivae are normal. Pupils are equal, round, and reactive to light. Right eye exhibits no discharge. Left eye exhibits no discharge. No scleral icterus.  Neck: Neck supple. No JVD present.  Cardiovascular: Normal rate, regular rhythm, normal heart sounds and intact distal pulses.  Exam reveals no gallop and no friction rub.   No murmur heard. Pulmonary/Chest: Effort normal and breath sounds normal. No respiratory distress. She has no wheezes. She has no rales.  Abdominal: Soft. Bowel sounds are normal. She exhibits no distension. There is no tenderness. There is no rebound and no guarding.  Genitourinary:  Genitourinary Comments: Continent   Musculoskeletal: She exhibits no edema, tenderness or deformity.  Generalized weakness. Unsteady gait   Lymphadenopathy:    She has no cervical adenopathy.  Neurological: She is alert.  Restless in bed constantly moaning and yelling out loudly but does not verbalize needs.  Skin: Skin is warm and dry. No rash noted. No erythema. No pallor.  Psychiatric:  Restless and has had increased confusion.      Labs reviewed: Basic Metabolic Panel:  Recent Labs  03/22/16  NA 140  K 4.0  BUN 17  CREATININE 0.8   Liver Function Tests:  Recent Labs  03/22/16  AST 14  ALT 7  ALKPHOS 72   CBC:  Recent Labs  03/22/16  WBC 4.5  4.5  HGB 9.9*  9.9*  HCT 32*  32*  PLT 224  224    Assessment/Plan:   1. Malignant neoplasm of upper-inner quadrant of left female breast, unspecified estrogen receptor status ([place]) Status post short term rehabilitation post hospital admission  [facility] from 02/06/16-02/23/16. She was at another SNF in [place] from 02/23/16-03/07/16. 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 She underwent CT guided liver biopsy of liver on 03/30/16 awaiting results.Plan was to  consider palliative chemotherapy.Has had progressive decline in condition Patient's POA desires to discharge home with community Home Care and Hospice service. Continue Pain management.continue Decadron 2 mg tablet twice daily.    2. Physical deconditioning Has had decreased oral  intake, increased confusion and restless unable to communicate needs. With her stage 4 Breast cancer with mets to liver, lungs and Brain Patient's POA desires to discharge home with community Home Care and Hospice service.  3. Diabetes mellitus type 2 with hyperosmolar coma, uncontrolled, unspecified long term insulin use status (HCC) CBG's ranging in the 200's-300's possible due to decadron. Will continue on Lantus and Humalog per SSI. Continue to monitor.   4. Convulsions, unspecified convulsion type ([place]) Has stage 4 breast cancer with Brain mets. No recent seizure activity reported. Continue on Keppra.   5. Essential hypertension, benign B/p elevated at times due to restlessness. Continue on coreg and amlodipine  6. Generalized anxiety disorder Continue on Lorazepam.   7. Moderate protein-calorie malnutrition (HCC) Encourage oral intake. Continue on Protein supplements.   8. Chronic generalized pain Patient very restless and confused unclear if in pain or confused due to brain mets.Continue on Fentanyl 50 mcg patch and Morphine PRN.   9. Confusion Afebrile.very restless and confused unclear if in pain or confused due to brain mets.continue on Seroquel for Psychosis.    Patient is being discharged with the following home health services:   [facility]    Patient is being discharged with the following durable medical equipment:    - Hospital Bed with 1/2 rails - Bedside Commode - Shower chair  -Walker  -Wheelchair    Patient has been advised to f/u with their PCP in 1-2 weeks to for a transitions of care visit.  Social services at their facility was responsible for arranging this  appointment.  Pt was provided with adequate prescriptions of noncontrolled medications to reach the scheduled appointment .  For controlled substances, a limited supply was provided as appropriate for the individual patient.  If the pt normally receives these medications from a pain clinic or has a contract with another physician, these medications should be received from that clinic or physician only).    Future labs/tests needed: None discharged on Hospice service.

## 2016-04-15 DEATH — deceased

## 2016-05-21 ENCOUNTER — Other Ambulatory Visit: Payer: Self-pay | Admitting: Nurse Practitioner

## 2016-12-01 IMAGING — CR DG THORACIC SPINE 2V
3 series · 3 of 3 positions shown · non-contrast
Comparison: None.

CLINICAL DATA: Mid back pain since yesterday. No known injury.
History of breast cancer.

EXAM:
THORACIC SPINE 2 VIEWS

[t t-spine a.p.]
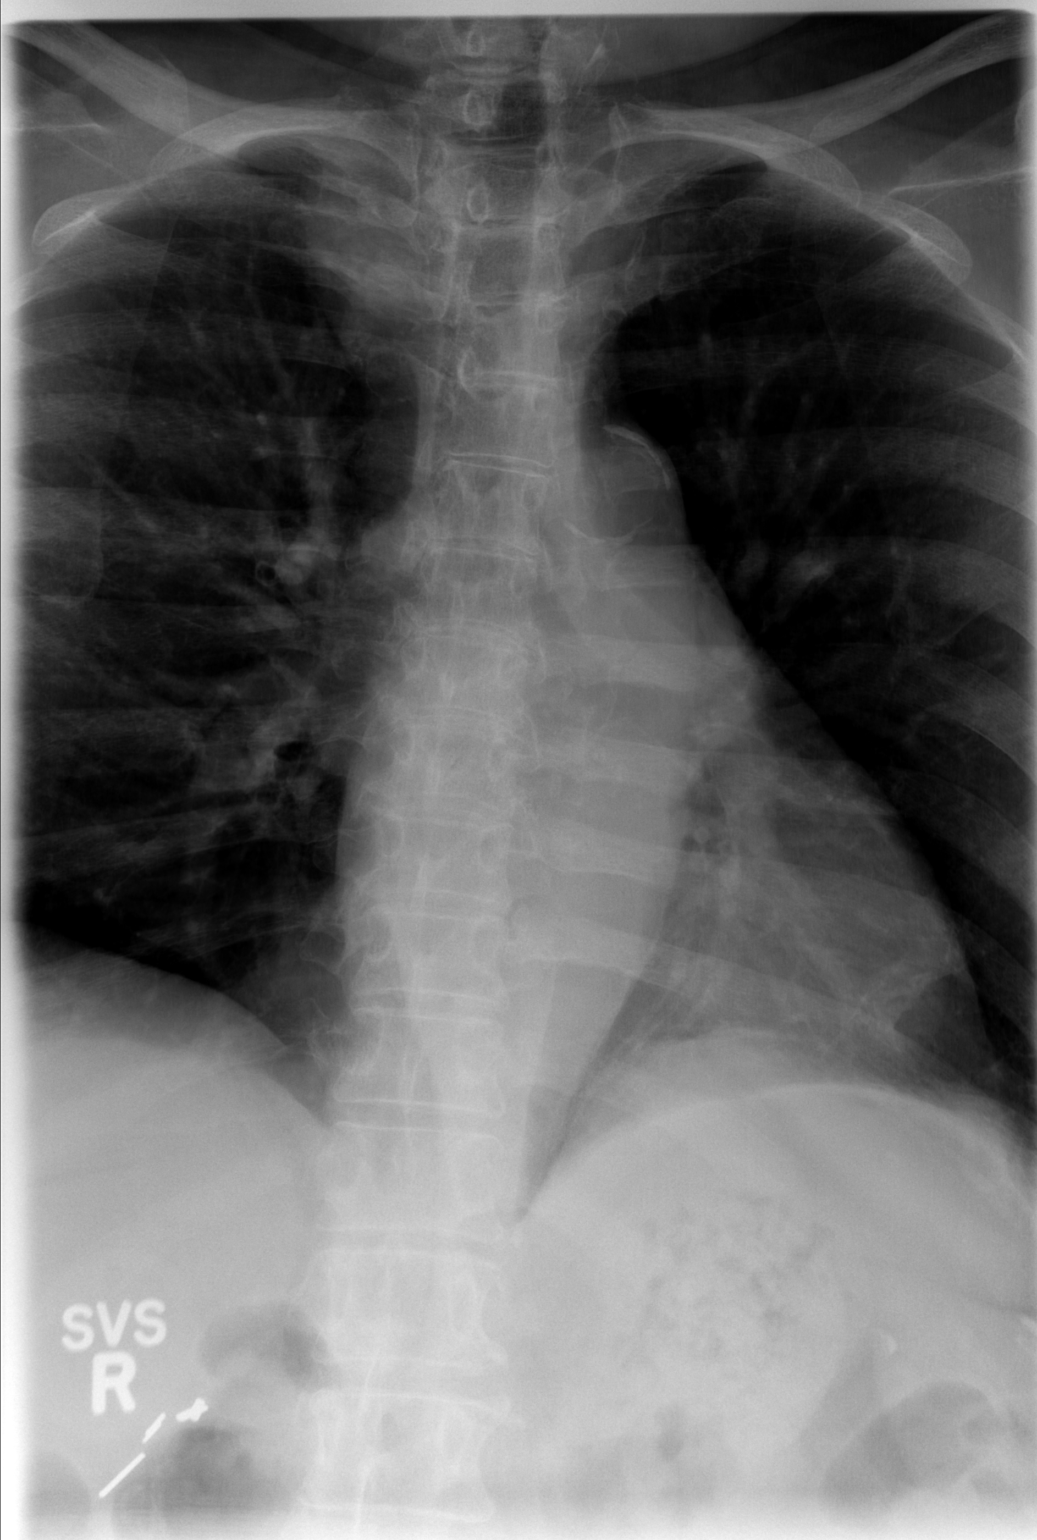

[t t-spine lat]
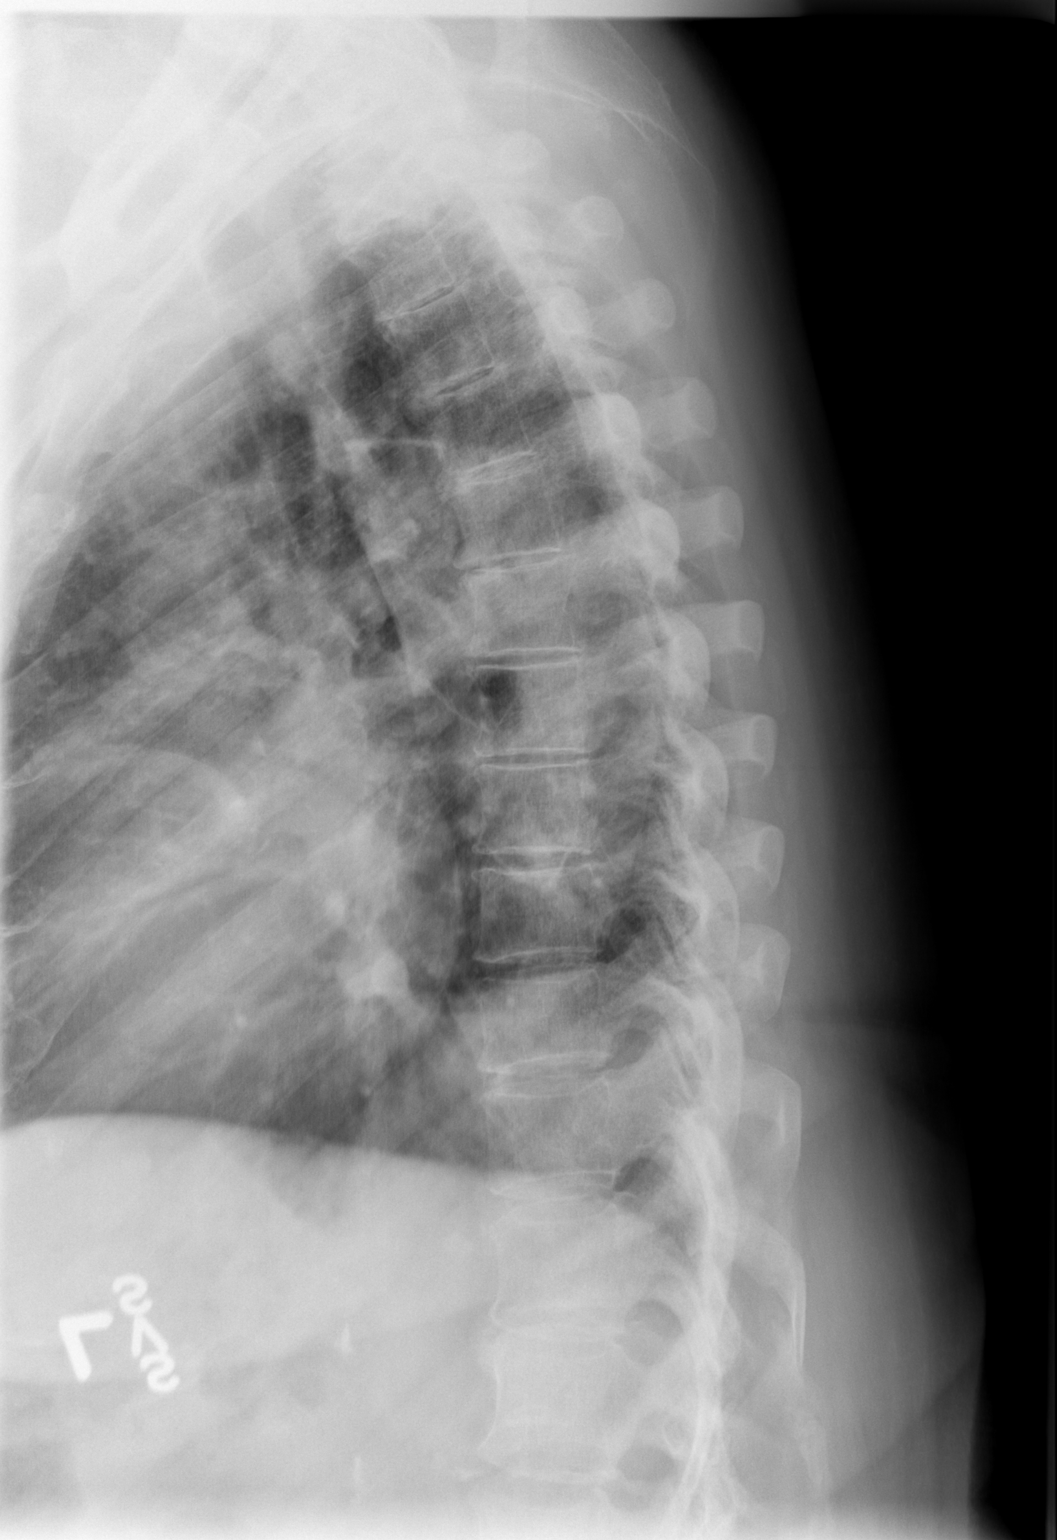

[t swimmers]
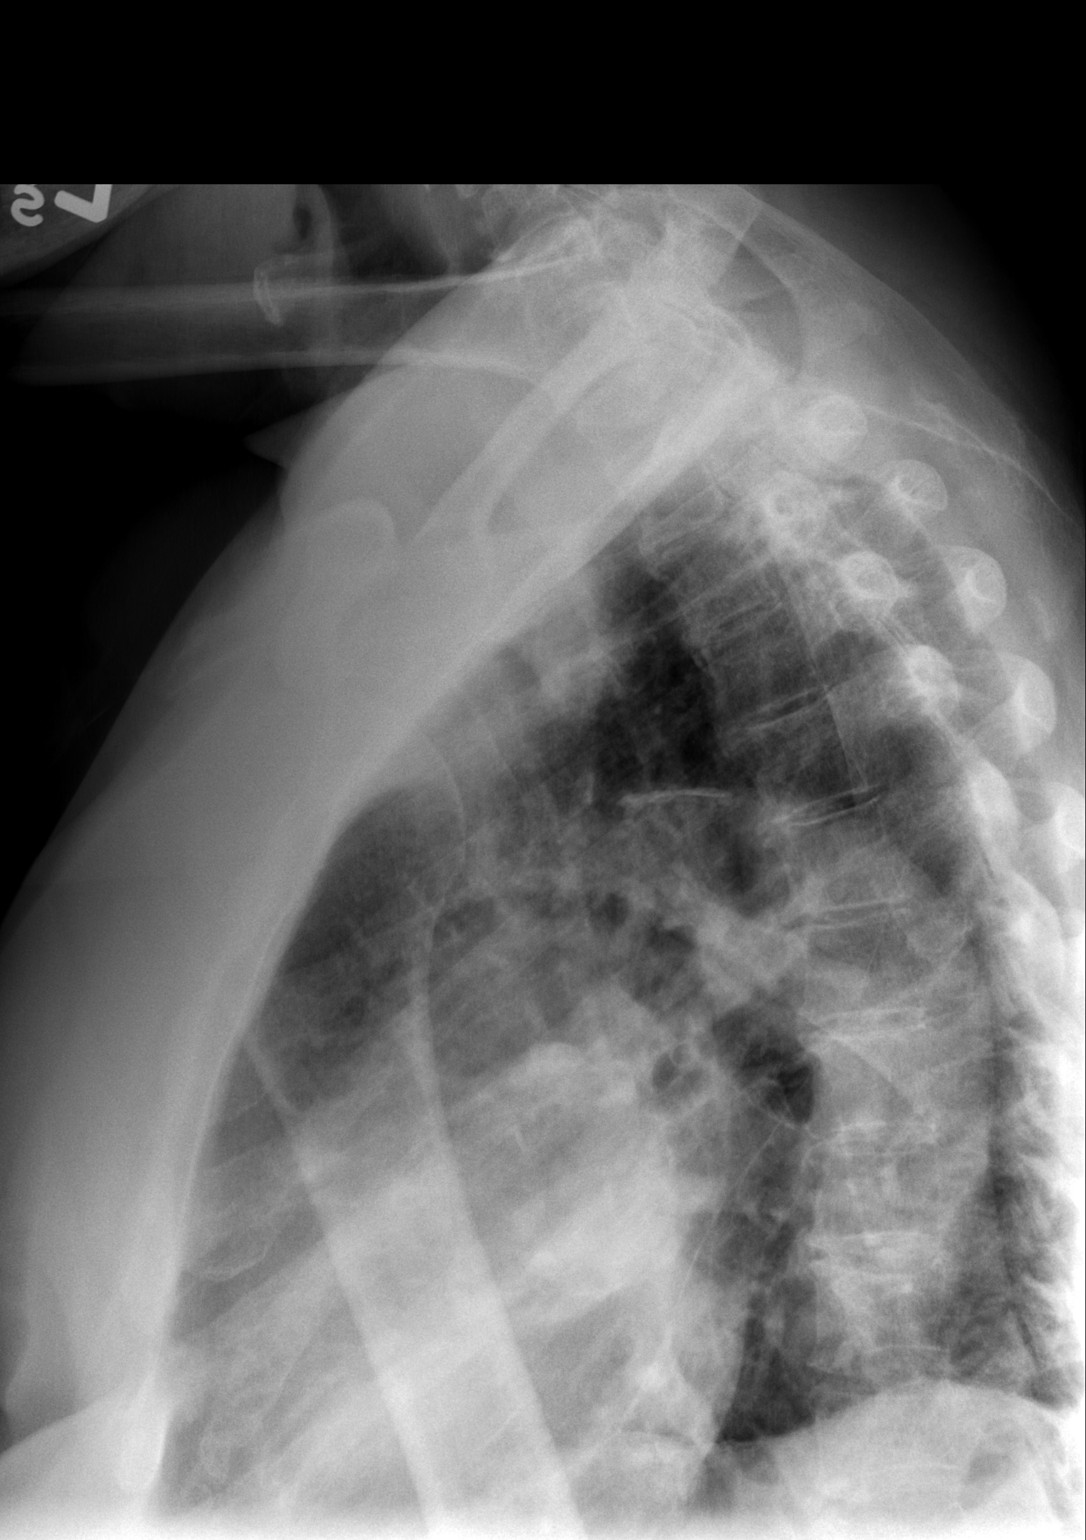

[3 of 3 positions shown; findings below may reference images not displayed]

FINDINGS: There is no evidence of thoracic spine fracture. Alignment is
normal. No other significant bone abnormalities are identified.
IMPRESSION: No acute osseous injury of the thoracic spine.
